# Patient Record
Sex: Male | Born: 1965 | Race: White | Hispanic: No | Marital: Married | Smoking: Never smoker
Health system: Southern US, Community
[De-identification: ages and names within clinical notes are randomized; demographics above are authoritative.]

## PROBLEM LIST (undated history)

## (undated) DIAGNOSIS — A630 Anogenital (venereal) warts: Secondary | ICD-10-CM

## (undated) DIAGNOSIS — E119 Type 2 diabetes mellitus without complications: Secondary | ICD-10-CM

## (undated) DIAGNOSIS — E785 Hyperlipidemia, unspecified: Secondary | ICD-10-CM

## (undated) DIAGNOSIS — M549 Dorsalgia, unspecified: Secondary | ICD-10-CM

## (undated) DIAGNOSIS — M2550 Pain in unspecified joint: Secondary | ICD-10-CM

## (undated) HISTORY — DX: Anogenital (venereal) warts: A63.0

## (undated) HISTORY — DX: Dorsalgia, unspecified: M54.9

## (undated) HISTORY — DX: Type 2 diabetes mellitus without complications: E11.9

## (undated) HISTORY — DX: Pain in unspecified joint: M25.50

## (undated) HISTORY — DX: Hyperlipidemia, unspecified: E78.5

---

## 1998-11-18 ENCOUNTER — Emergency Department (HOSPITAL_COMMUNITY): Admission: EM | Admit: 1998-11-18 | Discharge: 1998-11-18 | Payer: Self-pay | Admitting: Emergency Medicine

## 2001-02-17 HISTORY — PX: OTHER SURGICAL HISTORY: SHX169

## 2014-02-17 HISTORY — PX: HERNIA REPAIR: SHX51

## 2015-02-18 DIAGNOSIS — A630 Anogenital (venereal) warts: Secondary | ICD-10-CM

## 2015-02-18 HISTORY — DX: Anogenital (venereal) warts: A63.0

## 2017-08-03 ENCOUNTER — Ambulatory Visit: Payer: Self-pay | Admitting: Family Medicine

## 2017-08-17 ENCOUNTER — Encounter: Payer: Self-pay | Admitting: Family Medicine

## 2017-08-17 ENCOUNTER — Encounter (INDEPENDENT_AMBULATORY_CARE_PROVIDER_SITE_OTHER): Payer: Self-pay

## 2017-08-17 ENCOUNTER — Encounter

## 2017-08-17 ENCOUNTER — Ambulatory Visit: Payer: Self-pay | Admitting: Family Medicine

## 2017-08-17 VITALS — BP 108/68 | HR 82 | Temp 97.4°F | Ht 72.25 in | Wt 288.0 lb

## 2017-08-17 DIAGNOSIS — Z720 Tobacco use: Secondary | ICD-10-CM

## 2017-08-17 DIAGNOSIS — R945 Abnormal results of liver function studies: Secondary | ICD-10-CM

## 2017-08-17 DIAGNOSIS — E669 Obesity, unspecified: Secondary | ICD-10-CM | POA: Diagnosis not present

## 2017-08-17 DIAGNOSIS — Z1211 Encounter for screening for malignant neoplasm of colon: Secondary | ICD-10-CM

## 2017-08-17 DIAGNOSIS — A630 Anogenital (venereal) warts: Secondary | ICD-10-CM

## 2017-08-17 DIAGNOSIS — Z7689 Persons encountering health services in other specified circumstances: Secondary | ICD-10-CM | POA: Diagnosis not present

## 2017-08-17 DIAGNOSIS — R7989 Other specified abnormal findings of blood chemistry: Secondary | ICD-10-CM

## 2017-08-17 DIAGNOSIS — Z23 Encounter for immunization: Secondary | ICD-10-CM | POA: Diagnosis not present

## 2017-08-17 LAB — CBC WITH DIFFERENTIAL/PLATELET
BASOS ABS: 0 10*3/uL (ref 0.0–0.1)
Basophils Relative: 0.8 % (ref 0.0–3.0)
EOS ABS: 0.1 10*3/uL (ref 0.0–0.7)
Eosinophils Relative: 2.8 % (ref 0.0–5.0)
HEMATOCRIT: 47.2 % (ref 39.0–52.0)
Hemoglobin: 16.4 g/dL (ref 13.0–17.0)
LYMPHS PCT: 29.2 % (ref 12.0–46.0)
Lymphs Abs: 1.4 10*3/uL (ref 0.7–4.0)
MCHC: 34.8 g/dL (ref 30.0–36.0)
MCV: 89.6 fl (ref 78.0–100.0)
MONOS PCT: 8.6 % (ref 3.0–12.0)
Monocytes Absolute: 0.4 10*3/uL (ref 0.1–1.0)
NEUTROS ABS: 2.9 10*3/uL (ref 1.4–7.7)
Neutrophils Relative %: 58.6 % (ref 43.0–77.0)
PLATELETS: 218 10*3/uL (ref 150.0–400.0)
RBC: 5.27 Mil/uL (ref 4.22–5.81)
RDW: 12.6 % (ref 11.5–15.5)
WBC: 4.9 10*3/uL (ref 4.0–10.5)

## 2017-08-17 LAB — LIPID PANEL
Cholesterol: 157 mg/dL (ref 0–200)
HDL: 35.2 mg/dL — ABNORMAL LOW (ref >39.00)
LDL CALC: 90 mg/dL (ref 0–99)
NonHDL: 122.22
Total CHOL/HDL Ratio: 4
Triglycerides: 163 mg/dL — ABNORMAL HIGH (ref 0.0–149.0)
VLDL: 32.6 mg/dL (ref 0.0–40.0)

## 2017-08-17 LAB — COMPREHENSIVE METABOLIC PANEL
ALK PHOS: 59 U/L (ref 39–117)
ALT: 67 U/L — AB (ref 0–53)
AST: 44 U/L — AB (ref 0–37)
Albumin: 4.3 g/dL (ref 3.5–5.2)
BILIRUBIN TOTAL: 0.6 mg/dL (ref 0.2–1.2)
BUN: 14 mg/dL (ref 6–23)
CALCIUM: 9.2 mg/dL (ref 8.4–10.5)
CO2: 30 meq/L (ref 19–32)
CREATININE: 1.1 mg/dL (ref 0.40–1.50)
Chloride: 103 mEq/L (ref 96–112)
GFR: 74.5 mL/min (ref >60.00)
Glucose, Bld: 99 mg/dL (ref 70–99)
Potassium: 4.5 mEq/L (ref 3.5–5.1)
Sodium: 137 mEq/L (ref 135–145)
TOTAL PROTEIN: 6.7 g/dL (ref 6.0–8.3)

## 2017-08-17 LAB — HEMOGLOBIN A1C: HEMOGLOBIN A1C: 5.4 % (ref 4.6–6.5)

## 2017-08-17 LAB — TSH: TSH: 1.85 u[IU]/mL (ref 0.35–4.50)

## 2017-08-17 MED ORDER — IMIQUIMOD 5 % EX CREA: TOPICAL_CREAM | CUTANEOUS | 3 refills | Status: DC

## 2017-08-17 NOTE — Progress Notes (Signed)
Subjective:    Patient ID: Robert Holland, male    DOB: [birth year], 52 y.o.   MRN: [id number]  HPI This is a 52 yo male who presents today to establish care. Accompanied by his wife. 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 Enjoys fishing, golf.    Last CPE- 6-8 years Colonoscopy-  never Tdap-today Flu- some years Dental- not regularly Eye- not regular Exercise- active with his work, yard work Diet- has recently lost 8 pounds, has been watching portions, eating more vegetables Stress- denies excessive   No chest pain, no SOB, no headaches/dizzines, no abdominal pain, n/v/diarrhea/constipation. No dysuria/hematuria. Occasional knee pain, occasional back pain. Has had genital warts for several years.    Past Medical History:  Diagnosis Date  . Genital warts 2017   Past Surgical History:  Procedure Laterality Date  . HERNIA REPAIR  2016  . herniated disk fusion   2003   Family History  Problem Relation Age of Onset  . Cancer Father    Social History   Tobacco Use  . Smoking status: Never Smoker  . Smokeless tobacco: Current User  Substance Use Topics  . Alcohol use: Yes    Comment: social   . Drug use: Never      Review of Systems Per HPI    Objective:   Physical Exam Physical Exam  Constitutional: Oriented to person, place, and time. He appears well-developed and well-nourished.  HENT:  Head: Normocephalic and atraumatic.  Eyes: Conjunctivae are normal.  Neck: Normal range of motion. Neck supple.  Cardiovascular: Normal rate, regular rhythm and normal heart sounds.   Pulmonary/Chest: Effort normal and breath sounds normal.  Genital- proximal shaft and mons with scattered condyloma. No hernia.  Musculoskeletal: No edema.  Neurological: Alert and oriented to person, place, and time.  Skin: Skin is warm and dry.  Psychiatric: Normal mood and affect. Behavior is normal. Judgment and thought content normal.    Vitals reviewed.     BP 108/68 (BP Location: Left Arm, Patient Position: Sitting, Cuff Size: Large)   Pulse 82   Temp (!) 97.4 F (36.3 C) (Oral)   Ht 6' 0.25" (1.835 m)   Wt 288 lb (130.6 kg)   SpO2 97%   BMI 38.79 kg/m  Depression screen [facility] 2/9 08/17/2017  Decreased Interest 0  Down, Depressed, Hopeless 0  PHQ - 2 Score 0       Assessment & Plan:  1. Encounter to establish care - no recent care or outside records to obtain - encouraged regular dental and eye care - return in  1 year for annual visit  2. Obesity (BMI 35.0-39.9 without comorbidity) - encouraged his weight loss efforts - CBC with Differential - Comprehensive metabolic panel - TSH - Lipid Panel - Hemoglobin A1c  3. Genital warts - Provided written and verbal information regarding diagnosis and treatment. - if no improvement with 16 weeks topical therapy, he will let me know and I will refer to derm - imiquimod (ALDARA) 5 % cream; Apply topically 3 (three) times a week. At bedtime for maximum of 16 weeks.  Dispense: 12 each; Refill: 3  4. Screening for colon cancer - Ambulatory referral to Gastroenterology  5. Need for Tdap vaccination - Tdap vaccine greater than or equal to 7yo IM  6. Smokeless tobacco use - encouraged cessation and regular dental care  Clarene Reamer, FNP-BC  [facility], [place]  08/17/2017  10:04 AM

## 2017-08-17 NOTE — Patient Instructions (Addendum)
It was a pleasure to meet you today! I look forward to partnering with you for your health care needs  You will receive a call from gastroenterology to schedule your screening colonoscopy  I will notify you of lab results  I have sent a medication for warts to your pharmacy. If not better with treatment (total of 16 weeks) let me know and I will refer you to a dermatologist.  Follow up in 1 year for your annual exam  Imiquimod skin cream What is this medicine? IMIQUIMOD (i mi KWI mod) cream is used to treat external genital or anal warts. It is also used to treat other skin conditions such as actinic keratosis and certain types of skin cancer. This medicine may be used for other purposes; ask your health care provider or pharmacist if you have questions. COMMON BRAND NAME(S): Tawni Levy What should I tell my health care provider before I take this medicine? They need to know if you have any of these conditions: -decreased immune function -an unusual or allergic reaction to imiquimod, other medicines, foods, dyes, or preservatives -pregnant or trying to get pregnant -breast-feeding How should I use this medicine? This medicine is for external use only. Do not take by mouth. Follow the directions on the prescription label. Apply just before bedtime. Wash your hands before and after use. Apply a thin layer of cream and massage gently into the affected areas until no longer visible. Do not use in the mouth, eyes or the vagina. Use this medicine only on the affected area as directed by your health care provider. Do not use for longer than prescribed. It is important not to use more medicine than prescribed. To do so may increase the chance of side effects. Talk to your pediatrician regarding the use of this medicine in children. While this drug may be prescribed for children as young as 41 years of age for selected conditions, precautions do apply. Overdosage: If you think you have taken too  much of this medicine contact a poison control center or emergency room at once. NOTE: This medicine is only for you. Do not share this medicine with others. What if I miss a dose? If you miss a dose, use it as soon as you can. If it is almost time for your next dose, use only that dose. Do not use double or extra doses. What may interact with this medicine? Interactions are not expected. Do not use any other medicines on the treated area without asking your doctor or health care professional. This list may not describe all possible interactions. Give your health care provider a list of all the medicines, herbs, non-prescription drugs, or dietary supplements you use. Also tell them if you smoke, drink alcohol, or use illegal drugs. Some items may interact with your medicine. What should I watch for while using this medicine? Visit your health care professional for regular checks on your progress. Do not use this medicine until the skin has healed from any other drug (example: podofilox or podophyllin resin) or surgical skin treatment. Females should receive regular pelvic exams while being treated for genital warts. Most patients see improvement within 4 weeks. It may take up to 16 weeks to see a full clearing of the warts. This medicine is not a cure. New warts may develop during or after treatment. Avoid sexual (genital, anal, oral) contact while the cream is on the skin. If warts are visible in the genital area, sexual contact should be avoided until  the warts are treated. The use of latex condoms during sexual contact may reduce, but not entirely prevent, infecting others. This medicine may weaken condoms, diaphragms, cervical caps or other barrier devices and make them less effective as birth control. Do not cover the treated area with an airtight bandage. Cotton gauze dressings can be used. Cotton underwear can be worn after using this medicine on the genital or anal area. Actinic keratoses that  were not seen before may appear during treatment and may later go away. The treatment area and surrounding area may lighten or darken after treatment with this medicine. These skin color changes may be permanent in some patients. If you experience a skin reaction at the treatment site that interferes or prevents you from doing any daily activity, contact your health care provider. You may need a rest period from treatment. Treatment may be restarted once the reaction has gotten better as recommended by your doctor or health care professional. This medicine can make you more sensitive to the sun. Keep out of the sun. If you cannot avoid being in the sun, wear protective clothing and use sunscreen. Do not use sun lamps or tanning beds/booths. What side effects may I notice from receiving this medicine? Side effects that you should report to your doctor or health care professional as soon as possible: -open sores with or without drainage -skin infection -skin rash -unusual or severe skin reaction Side effects that usually do not require medical attention (report to your doctor or health care professional if they continue or are bothersome): -burning or itching -redness of the skin (very common but is usually not painful or harmful) -scabbing, crusting, or peeling skin -skin that becomes hard or thickened -swelling of the skin This list may not describe all possible side effects. Call your doctor for medical advice about side effects. You may report side effects to FDA at 1-800-FDA-1088. Where should I keep my medicine? Keep out of the reach of children. Store between 4 and 25 degrees C (39 and 77 degrees F). Do not freeze. Throw away any unused medicine after the expiration date. Discard packet after applying to affected area. Partial packets should not be saved or reused. NOTE: This sheet is a summary. It may not cover all possible information. If you have questions about this medicine, talk to your  doctor, pharmacist, or health care provider.  2018 Elsevier/Gold Standard (2008-01-18 10:33:25)  Genital Warts Genital warts are a common STD (sexually transmitted disease). They may appear as small bumps on the tissues of the genital area or anal area. Sometimes, they can become irritated and cause pain. Genital warts are easily passed to other people through sexual contact. Getting treatment is important because genital warts can lead to other problems. In females, the virus that causes genital warts may increase the risk of cervical cancer. What are the causes? Genital warts are caused by a virus that is called human papillomavirus (HPV). HPV is spread by having unprotected sex with an infected person. It can be spread through vaginal, anal, and oral sex. Many people do not know that they are infected. They may be infected for years without problems. However, even if they do not have problems, they can pass the infection to their sexual partners. What increases the risk? Genital warts are more likely to develop in:  People who have unprotected sex.  People who have multiple sexual partners.  People who become sexually active before they are 53 years of age.  Men who  are not circumcised.  Women who have a male sexual partner who is not circumcised.  People who have a weakened body defense system (immune system) due to disease or medicine.  People who smoke.  What are the signs or symptoms? Symptoms of genital warts include:  Small growths in the genital area or anal area. These warts often grow in clusters.  Itching and irritation in the genital area or anal area.  Bleeding from the warts.  Painful sexual intercourse.  How is this diagnosed? Genital warts can usually be diagnosed from their appearance on the vagina, vulva, penis, perineum, anus, or rectum. Tests may also be done, such as:  Biopsy. A tissue sample is removed so it can be looked at under a  microscope.  Colposcopy. In females, a magnifying tool is used to examine the vagina and cervix. Certain solutions may be used to make the HPV cells change color so they can be seen more easily.  A Pap test in females.  Tests for other STDs.  How is this treated? Treatment for genital warts may include:  Applying prescription medicines to the warts. These may be solutions or creams.  Freezing the warts with liquid nitrogen (cryotherapy).  Burning the warts with: ? Laser treatment. ? An electrified probe (electrocautery).  Injecting a substance (Candida antigen or Trichophyton antigen) into the warts to help the body's immune system to fight off the warts.  Interferon injections.  Surgery to remove the warts.  Follow these instructions at home: Medicines  Apply over-the-counter and prescription medicines only as told by your health care provider.  Do not treat genital warts with medicines that are used for treating hand warts.  Talk with your health care provider about using over-the-counter anti-itch creams. General instructions  Do not touch or scratch the warts.  Do not have sex until your treatment has been completed.  Tell your current and past sexual partners about your condition because they may also need treatment.  Keep all follow-up visits as told by your health care provider. This is important.  After treatment, use condoms during sex to prevent future infections. Other Instructions for Women  Women who have genital warts might need increased screening for cervical cancer. This type of cancer is slow growing and can be cured if it is found early. Chances of developing cervical cancer are increased with HPV.  If you become pregnant, tell your health care provider that you have had HPV. Your health care provider will monitor you closely during pregnancy to be sure that your baby is safe. How is this prevented? Talk with your health care provider about getting  the HPV vaccines. These vaccines prevent some HPV infections and cancers. It is recommended that the vaccine be given to males and females who are 59-10 years of age. It will not work if you already have HPV, and it is not recommended for pregnant women. Contact a health care provider if:  You have redness, swelling, or pain in the area of the treated skin.  You have a fever.  You feel generally ill.  You feel lumps in and around your genital area or anal area.  You have bleeding in your genital area or anal area.  You have pain during sexual intercourse. This information is not intended to replace advice given to you by your health care provider. Make sure you discuss any questions you have with your health care provider. Document Released: 02/01/2000 Document Revised: 07/12/2015 Document Reviewed: 05/01/2014 Elsevier Interactive Patient  Education  2018 Elsevier Inc.  

## 2017-08-17 NOTE — Addendum Note (Signed)
Addended by: Clarene Reamer B on: 08/17/2017 12:51 PM   Modules accepted: Orders

## 2017-08-18 ENCOUNTER — Telehealth: Payer: Self-pay | Admitting: Gastroenterology

## 2017-08-18 NOTE — Telephone Encounter (Signed)
Patient called back and is ready to schedule screening colonoscopy.

## 2017-08-18 NOTE — Telephone Encounter (Signed)
LVM returning patients call.  Thanks Robert Holland 

## 2017-08-25 ENCOUNTER — Other Ambulatory Visit: Payer: Self-pay

## 2017-08-25 DIAGNOSIS — Z1211 Encounter for screening for malignant neoplasm of colon: Secondary | ICD-10-CM

## 2017-08-28 ENCOUNTER — Telehealth: Payer: Self-pay

## 2017-08-28 ENCOUNTER — Other Ambulatory Visit: Payer: Self-pay

## 2017-08-28 MED ORDER — NA SULFATE-K SULFATE-MG SULF 17.5-3.13-1.6 GM/177ML PO SOLN: 1.0000 | Freq: Once | ORAL | 0 refills | Status: AC

## 2017-08-28 NOTE — Telephone Encounter (Signed)
Faxed prescription and instructions to pharmacy in patients chart just in case he did not get my message.  Thanks [employer]

## 2017-08-31 ENCOUNTER — Encounter: Payer: Self-pay | Admitting: *Deleted

## 2017-09-01 ENCOUNTER — Ambulatory Visit
Admission: RE | Admit: 2017-09-01 | Discharge: 2017-09-01 | Disposition: A | Discharge status: Home / Self Care | Payer: BC Managed Care – PPO | Source: Ambulatory Visit | Attending: Gastroenterology | Admitting: Gastroenterology

## 2017-09-01 ENCOUNTER — Encounter
Admission: RE | Disposition: A | Discharge status: Home / Self Care | Payer: Self-pay | Source: Ambulatory Visit | Attending: Gastroenterology

## 2017-09-01 ENCOUNTER — Ambulatory Visit: Payer: BC Managed Care – PPO | Admitting: Certified Registered Nurse Anesthetist

## 2017-09-01 ENCOUNTER — Encounter: Payer: Self-pay | Admitting: *Deleted

## 2017-09-01 DIAGNOSIS — K5730 Diverticulosis of large intestine without perforation or abscess without bleeding: Secondary | ICD-10-CM | POA: Diagnosis not present

## 2017-09-01 DIAGNOSIS — D1779 Benign lipomatous neoplasm of other sites: Secondary | ICD-10-CM | POA: Insufficient documentation

## 2017-09-01 DIAGNOSIS — D125 Benign neoplasm of sigmoid colon: Secondary | ICD-10-CM | POA: Diagnosis not present

## 2017-09-01 DIAGNOSIS — D120 Benign neoplasm of cecum: Secondary | ICD-10-CM | POA: Diagnosis not present

## 2017-09-01 DIAGNOSIS — D122 Benign neoplasm of ascending colon: Secondary | ICD-10-CM

## 2017-09-01 DIAGNOSIS — K621 Rectal polyp: Secondary | ICD-10-CM

## 2017-09-01 DIAGNOSIS — K648 Other hemorrhoids: Secondary | ICD-10-CM

## 2017-09-01 DIAGNOSIS — D128 Benign neoplasm of rectum: Secondary | ICD-10-CM | POA: Diagnosis not present

## 2017-09-01 DIAGNOSIS — D124 Benign neoplasm of descending colon: Secondary | ICD-10-CM | POA: Diagnosis not present

## 2017-09-01 DIAGNOSIS — Z1211 Encounter for screening for malignant neoplasm of colon: Secondary | ICD-10-CM

## 2017-09-01 HISTORY — PX: COLONOSCOPY WITH PROPOFOL: SHX5780

## 2017-09-01 SURGERY — COLONOSCOPY WITH PROPOFOL
Anesthesia: General

## 2017-09-01 MED ORDER — LIDOCAINE HCL (CARDIAC) PF 100 MG/5ML IV SOSY
PREFILLED_SYRINGE | INTRAVENOUS | Status: DC | PRN
  Administered 2017-09-01: 50 mg via INTRAVENOUS

## 2017-09-01 MED ORDER — PROPOFOL 10 MG/ML IV BOLUS
INTRAVENOUS | Status: DC | PRN
  Administered 2017-09-01: 70 mg via INTRAVENOUS

## 2017-09-01 MED ORDER — PROPOFOL 500 MG/50ML IV EMUL
INTRAVENOUS | Status: AC
  Filled 2017-09-01: qty 50

## 2017-09-01 MED ORDER — LIDOCAINE HCL (PF) 2 % IJ SOLN
INTRAMUSCULAR | Status: AC
  Filled 2017-09-01: qty 10

## 2017-09-01 MED ORDER — SODIUM CHLORIDE 0.9 % IV SOLN
INTRAVENOUS | Status: DC
  Administered 2017-09-01: 1000 mL via INTRAVENOUS

## 2017-09-01 MED ORDER — PROPOFOL 500 MG/50ML IV EMUL
INTRAVENOUS | Status: DC | PRN
  Administered 2017-09-01: 175 ug/kg/min via INTRAVENOUS

## 2017-09-01 NOTE — H&P (Signed)
Cephas Darby, MD [address]  [place], [place] [address]  Main: [phone]  Fax: [phone] Pager: [phone]  Primary Care Physician:  Elby Beck, FNP Primary Gastroenterologist:  Dr. Cephas Darby  Pre-Procedure History & Physical: HPI:  Robert Holland is a 52 y.o. male is here for an colonoscopy.   Past Medical History:  Diagnosis Date  . Genital warts 2017    Past Surgical History:  Procedure Laterality Date  . HERNIA REPAIR  2016  . herniated disk fusion   2003    Prior to Admission medications   Medication Sig Start Date End Date Taking? Authorizing Provider  imiquimod (ALDARA) 5 % cream Apply topically 3 (three) times a week. At bedtime for maximum of 16 weeks. 08/17/17   Elby Beck, FNP    Allergies as of 08/25/2017  . (No Known Allergies)    Family History  Problem Relation Age of Onset  . Cancer Father     Social History   Socioeconomic History  . Marital status: Divorced    Spouse name: Not on file  . Number of children: Not on file  . Years of education: Not on file  . Highest education level: Not on file  Occupational History  . Not on file  Social Needs  . Financial resource strain: Not on file  . Food insecurity:    Worry: Not on file    Inability: Not on file  . Transportation needs:    Medical: Not on file    Non-medical: Not on file  Tobacco Use  . Smoking status: Never Smoker  . Smokeless tobacco: Current User    Types: Snuff  Substance and Sexual Activity  . Alcohol use: Yes    Comment: social   . Drug use: Never  . Sexual activity: Yes    Partners: Female  Lifestyle  . Physical activity:    Days per week: Not on file    Minutes per session: Not on file  . Stress: Not on file  Relationships  . Social connections:    Talks on phone: Not on file    Gets together: Not on file    Attends religious service: Not on file    Active member of club or organization: Not on file    Attends  meetings of clubs or organizations: Not on file    Relationship status: Not on file  . Intimate partner violence:    Fear of current or ex partner: Not on file    Emotionally abused: Not on file    Physically abused: Not on file    Forced sexual activity: Not on file  Other Topics Concern  . Not on file  Social History Narrative  . Not on file    Review of Systems: See HPI, otherwise negative ROS  Physical Exam: BP 127/90   Pulse 80   Temp (!) 96.9 F (36.1 C) (Tympanic)   Resp 16   Ht 6' (1.829 m)   Wt 275 lb (124.7 kg)   SpO2 100%   BMI 37.30 kg/m  General:   Alert,  pleasant and cooperative in NAD Head:  Normocephalic and atraumatic. Neck:  Supple; no masses or thyromegaly. Lungs:  Clear throughout to auscultation.    Heart:  Regular rate and rhythm. Abdomen:  Soft, nontender and nondistended. Normal bowel sounds, without guarding, and without rebound.   Neurologic:  Alert and  oriented x4;  grossly normal neurologically.  Impression/Plan: Robert Holland is  here for an colonoscopy to be performed for colon cancer screening  Risks, benefits, limitations, and alternatives regarding  colonoscopy have been reviewed with the patient.  Questions have been answered.  All parties agreeable.   Sherri Sear, MD  09/01/2017, 2:52 PM

## 2017-09-01 NOTE — Transfer of Care (Signed)
Immediate Anesthesia Transfer of Care Note  Patient: Robert Holland  Procedure(s) Performed: COLONOSCOPY WITH PROPOFOL (N/A )  Patient Location: PACU  Anesthesia Type:General  Level of Consciousness: awake, alert  and oriented  Airway & Oxygen Therapy: Patient Spontanous Breathing  Post-op Assessment: Report given to RN and Post -op Vital signs reviewed and stable  Post vital signs: Reviewed and stable  Last Vitals:  Vitals Value Taken Time  BP 114/67 09/01/2017  3:47 PM  Temp 35.9 C 09/01/2017  3:48 PM  Pulse 78 09/01/2017  3:47 PM  Resp 16 09/01/2017  3:47 PM  SpO2 97 % 09/01/2017  3:47 PM  Vitals shown include unvalidated device data.  Last Pain:  Vitals:   09/01/17 1548  TempSrc: Tympanic         Complications: No apparent anesthesia complications

## 2017-09-01 NOTE — Anesthesia Post-op Follow-up Note (Signed)
Anesthesia QCDR form completed.        

## 2017-09-01 NOTE — Op Note (Signed)
[facility] Gastroenterology Patient Name: Robert Holland Procedure Date: 09/01/2017 2:56 PM MRN: [id number] Account #: [id number] Date of Birth: [birth year] Admit Type: Outpatient Age: 52 Room: [facility] ENDO ROOM 4 Gender: Male Note Status: Finalized Procedure:            Colonoscopy Indications:          Screening for colorectal malignant neoplasm, This is                        the patient's first colonoscopy Providers:            Lin Landsman MD, MD Referring MD:         Elby Beck (Referring MD) Medicines:            Monitored Anesthesia Care Complications:        No immediate complications. Estimated blood loss: None. Procedure:            Pre-Anesthesia Assessment:                       - Prior to the procedure, a History and Physical was                        performed, and patient medications and allergies were                        reviewed. The patient is competent. The risks and                        benefits of the procedure and the sedation options and                        risks were discussed with the patient. All questions                        were answered and informed consent was obtained.                        Patient identification and proposed procedure were                        verified by the physician, the nurse, the                        anesthesiologist, the anesthetist and the technician in                        the pre-procedure area in the procedure room in the                        endoscopy suite. Mental Status Examination: alert and                        oriented. Airway Examination: normal oropharyngeal                        airway and neck mobility. Respiratory Examination:                        clear to auscultation. CV Examination: normal.  Prophylactic Antibiotics: The patient does not require                        prophylactic antibiotics. Prior Anticoagulants: The         patient has taken no previous anticoagulant or                        antiplatelet agents. ASA Grade Assessment: II - A                        patient with mild systemic disease. After reviewing the                        risks and benefits, the patient was deemed in                        satisfactory condition to undergo the procedure. The                        anesthesia plan was to use monitored anesthesia care                        (MAC). Immediately prior to administration of                        medications, the patient was re-assessed for adequacy                        to receive sedatives. The heart rate, respiratory rate,                        oxygen saturations, blood pressure, adequacy of                        pulmonary ventilation, and response to care were                        monitored throughout the procedure. The physical status                        of the patient was re-assessed after the procedure.                       After obtaining informed consent, the colonoscope was                        passed under direct vision. Throughout the procedure,                        the patient's blood pressure, pulse, and oxygen                        saturations were monitored continuously. The                        Colonoscope was introduced through the anus and                        advanced to the the terminal ileum. The colonoscopy was  performed with moderate difficulty due to poor bowel                        prep, significant looping and the patient's body                        habitus. Successful completion of the procedure was                        aided by applying abdominal pressure and lavage. The                        patient tolerated the procedure well. The quality of                        the bowel preparation was evaluated using the BBPS                        [facility] Bowel Preparation Scale) with scores of: Right                         Colon = 3, Transverse Colon = 3 and Left Colon = 3                        (entire mucosa seen well with no residual staining,                        small fragments of stool or opaque liquid). The total                        BBPS score equals 9. Findings:      The perianal and digital rectal examinations were normal. Pertinent       negatives include normal sphincter tone and no palpable rectal lesions.      A 6 mm polyp was found in the cecum. The polyp was sessile. The polyp       was removed with a hot snare. Resection and retrieval were complete.      A 4 mm polyp was found in the proximal ascending colon. The polyp was       sessile. The polyp was removed with a cold biopsy forceps. Resection and       retrieval were complete.      Three sessile polyps were found in the rectum, sigmoid colon and       descending colon. The polyps were 6 to 7 mm in size. These polyps were       removed with a hot snare. Resection and retrieval were complete.      A few small-mouthed diverticula were found in the sigmoid colon.      Non-bleeding internal hemorrhoids were found during retroflexion. The       hemorrhoids were medium-sized.      There was a small lipoma, in the proximal ascending colon.      The terminal ileum appeared normal. Impression:           - One 6 mm polyp in the cecum, removed with a hot                        snare. Resected and retrieved.                       -  One 4 mm polyp in the proximal ascending colon,                        removed with a cold biopsy forceps. Resected and                        retrieved.                       - Three 6 to 7 mm polyps in the rectum, in the sigmoid                        colon and in the descending colon, removed with a hot                        snare. Resected and retrieved.                       - Diverticulosis in the sigmoid colon.                       - Non-bleeding internal hemorrhoids. Recommendation:       -  Discharge patient to home (with escort).                       - Low fat diet and low sodium diet today.                       - Continue present medications.                       - Await pathology results.                       - Repeat colonoscopy in 3 years for surveillance of                        multiple polyps. Procedure Code(s):    --- Professional ---                       [id number], Colonoscopy, flexible; with removal of tumor(s),                        polyp(s), or other lesion(s) by snare technique                       45380, 40, Colonoscopy, flexible; with biopsy, single                        or multiple Diagnosis Code(s):    --- Professional ---                       Z12.11, Encounter for screening for malignant neoplasm                        of colon                       D12.0, Benign neoplasm of cecum                       D12.2,  Benign neoplasm of ascending colon                       K62.1, Rectal polyp                       D12.5, Benign neoplasm of sigmoid colon                       D12.4, Benign neoplasm of descending colon                       K64.8, Other hemorrhoids                       K57.30, Diverticulosis of large intestine without                        perforation or abscess without bleeding CPT copyright 2017 American Medical Association. All rights reserved. The codes documented in this report are preliminary and upon coder review may  be revised to meet current compliance requirements. Dr. Ulyess Mort Lin Landsman MD, MD 09/01/2017 3:47:47 PM This report has been signed electronically. Number of Addenda: 0 Note Initiated On: 09/01/2017 2:56 PM Scope Withdrawal Time: 0 hours 28 minutes 56 seconds  Total Procedure Duration: 0 hours 32 minutes 17 seconds       [facility]

## 2017-09-01 NOTE — Anesthesia Postprocedure Evaluation (Signed)
Anesthesia Post Note  Patient: Miquan Tandon  Procedure(s) Performed: COLONOSCOPY WITH PROPOFOL (N/A )  Patient location during evaluation: Endoscopy Anesthesia Type: General Level of consciousness: awake and alert Pain management: pain level controlled Vital Signs Assessment: post-procedure vital signs reviewed and stable Respiratory status: spontaneous breathing, nonlabored ventilation, respiratory function stable and patient connected to nasal cannula oxygen Cardiovascular status: blood pressure returned to baseline and stable Postop Assessment: no apparent nausea or vomiting Anesthetic complications: no     Last Vitals:  Vitals:   09/01/17 1547 09/01/17 1548  BP: 114/67   Pulse: 74   Resp: (!) 146   Temp: 36.4 C (!) 35.9 C  SpO2: 98%     Last Pain:  Vitals:   09/01/17 1548  TempSrc: Tympanic                 Martha Clan

## 2017-09-01 NOTE — Anesthesia Procedure Notes (Signed)
Date/Time: 09/01/2017 3:04 PM Performed by: Johnna Acosta, CRNA Pre-anesthesia Checklist: Patient identified, Emergency Drugs available, Suction available, Patient being monitored and Timeout performed Patient Re-evaluated:Patient Re-evaluated prior to induction Oxygen Delivery Method: Nasal cannula Preoxygenation: Pre-oxygenation with 100% oxygen

## 2017-09-01 NOTE — Anesthesia Preprocedure Evaluation (Signed)
Anesthesia Evaluation  Patient identified by MRN, date of birth, ID band Patient awake    Reviewed: Allergy & Precautions, H&P , NPO status , Patient's Chart, lab work & pertinent test results, reviewed documented beta blocker date and time   History of Anesthesia Complications Negative for: history of anesthetic complications  Airway Mallampati: I  TM Distance: >3 FB Neck ROM: full    Dental  (+) Dental Advidsory Given, Caps, Teeth Intact   Pulmonary neg pulmonary ROS,           Cardiovascular Exercise Tolerance: Good negative cardio ROS       Neuro/Psych negative neurological ROS  negative psych ROS   GI/Hepatic negative GI ROS, Neg liver ROS,   Endo/Other  negative endocrine ROS  Renal/GU negative Renal ROS  negative genitourinary   Musculoskeletal   Abdominal   Peds  Hematology negative hematology ROS (+)   Anesthesia Other Findings Past Medical History: 2017: Genital warts   Reproductive/Obstetrics negative OB ROS                             Anesthesia Physical Anesthesia Plan  ASA: II  Anesthesia Plan: General   Post-op Pain Management:    Induction: Intravenous  PONV Risk Score and Plan: 2 and Propofol infusion and TIVA  Airway Management Planned: Nasal Cannula  Additional Equipment:   Intra-op Plan:   Post-operative Plan:   Informed Consent: I have reviewed the patients History and Physical, chart, labs and discussed the procedure including the risks, benefits and alternatives for the proposed anesthesia with the patient or authorized representative who has indicated his/her understanding and acceptance.   Dental Advisory Given  Plan Discussed with: Anesthesiologist, CRNA and Surgeon  Anesthesia Plan Comments:         Anesthesia Quick Evaluation

## 2017-09-02 ENCOUNTER — Encounter: Payer: Self-pay | Admitting: Gastroenterology

## 2017-09-04 LAB — SURGICAL PATHOLOGY

## 2017-09-07 ENCOUNTER — Encounter: Payer: Self-pay | Admitting: Gastroenterology

## 2017-09-21 ENCOUNTER — Encounter: Payer: Self-pay | Admitting: Family Medicine

## 2017-09-21 ENCOUNTER — Ambulatory Visit (INDEPENDENT_AMBULATORY_CARE_PROVIDER_SITE_OTHER): Payer: BC Managed Care – PPO | Admitting: Family Medicine

## 2017-09-21 DIAGNOSIS — R7989 Other specified abnormal findings of blood chemistry: Secondary | ICD-10-CM

## 2017-09-21 DIAGNOSIS — R945 Abnormal results of liver function studies: Secondary | ICD-10-CM

## 2017-09-21 LAB — HEPATIC FUNCTION PANEL
ALT: 53 U/L (ref 0–53)
AST: 30 U/L (ref 0–37)
Albumin: 4.3 g/dL (ref 3.5–5.2)
Alkaline Phosphatase: 62 U/L (ref 39–117)
BILIRUBIN DIRECT: 0.1 mg/dL (ref 0.0–0.3)
BILIRUBIN TOTAL: 0.6 mg/dL (ref 0.2–1.2)
Total Protein: 6.9 g/dL (ref 6.0–8.3)

## 2017-09-21 NOTE — Progress Notes (Signed)
   Subjective:    Patient ID: Stanely Sexson, male    DOB: June 01, 1965, 52 y.o.   MRN: [id number]  HPI Patient presented today for lab only visit, was scheduled for OV. Sent to lab.    Review of Systems     Objective:   Physical Exam    BP 114/78 (BP Location: Right Arm, Patient Position: Sitting, Cuff Size: Large)   Pulse 85   Temp 97.9 F (36.6 C) (Oral)   Ht 6' (1.829 m)   Wt 284 lb (128.8 kg)   SpO2 97%   BMI 38.52 kg/m  Wt Readings from Last 3 Encounters:  09/21/17 284 lb (128.8 kg)  09/01/17 275 lb (124.7 kg)  08/17/17 288 lb (130.6 kg)       Assessment & Plan:  Labs

## 2018-09-27 ENCOUNTER — Other Ambulatory Visit: Payer: Self-pay

## 2018-09-27 DIAGNOSIS — Z20822 Contact with and (suspected) exposure to covid-19: Secondary | ICD-10-CM

## 2018-09-28 LAB — NOVEL CORONAVIRUS, NAA: SARS-CoV-2, NAA: NOT DETECTED

## 2018-11-29 ENCOUNTER — Other Ambulatory Visit: Payer: Self-pay

## 2018-11-29 DIAGNOSIS — Z20822 Contact with and (suspected) exposure to covid-19: Secondary | ICD-10-CM

## 2018-11-30 LAB — NOVEL CORONAVIRUS, NAA: SARS-CoV-2, NAA: NOT DETECTED

## 2019-03-31 ENCOUNTER — Ambulatory Visit (INDEPENDENT_AMBULATORY_CARE_PROVIDER_SITE_OTHER): Payer: BC Managed Care – PPO | Admitting: Family Medicine

## 2019-03-31 ENCOUNTER — Other Ambulatory Visit: Payer: Self-pay

## 2019-03-31 ENCOUNTER — Encounter (INDEPENDENT_AMBULATORY_CARE_PROVIDER_SITE_OTHER): Payer: Self-pay | Admitting: Family Medicine

## 2019-03-31 VITALS — BP 129/79 | HR 77 | Temp 98.0°F | Ht 72.0 in | Wt 295.0 lb

## 2019-03-31 DIAGNOSIS — E782 Mixed hyperlipidemia: Secondary | ICD-10-CM

## 2019-03-31 DIAGNOSIS — R0602 Shortness of breath: Secondary | ICD-10-CM | POA: Diagnosis not present

## 2019-03-31 DIAGNOSIS — R5383 Other fatigue: Secondary | ICD-10-CM | POA: Diagnosis not present

## 2019-03-31 DIAGNOSIS — Z1331 Encounter for screening for depression: Secondary | ICD-10-CM

## 2019-03-31 DIAGNOSIS — R7989 Other specified abnormal findings of blood chemistry: Secondary | ICD-10-CM

## 2019-03-31 DIAGNOSIS — Z9189 Other specified personal risk factors, not elsewhere classified: Secondary | ICD-10-CM

## 2019-03-31 DIAGNOSIS — Z0289 Encounter for other administrative examinations: Secondary | ICD-10-CM

## 2019-03-31 DIAGNOSIS — Z6841 Body Mass Index (BMI) 40.0 and over, adult: Secondary | ICD-10-CM

## 2019-03-31 NOTE — Progress Notes (Signed)
Chief Complaint:   OBESITY Robert Holland (MR# [id number]) is a 54 y.o. male who presents for evaluation and treatment of obesity and related comorbidities. Current BMI is Body mass index is 40.01 kg/m. Robert Holland has been struggling with his weight for many years and has been unsuccessful in either losing weight, maintaining weight loss, or reaching his healthy weight goal.  Robert Holland is currently in the action stage of change and ready to dedicate time achieving and maintaining a healthier weight. Robert Holland is interested in becoming our patient and working on intensive lifestyle modifications including (but not limited to) diet and exercise for weight loss.  Robert Holland's habits were reviewed today and are as follows: His family eats meals together, he thinks his family will eat healthier with him, his desired weight loss is 60 lbs, he has been heavy most of his life, he started gaining weight 5 years ago, his heaviest weight ever was 340 pounds, he skips meals frequently, he frequently makes poor food choices, he frequently eats larger portions than normal and he struggles with emotional eating.  Depression Screen Robert Holland's Food and Mood (modified PHQ-9) score was 7.  Depression screen PHQ 2/9 03/31/2019  Decreased Interest 1  Down, Depressed, Hopeless 1  PHQ - 2 Score 2  Altered sleeping 1  Tired, decreased energy 2  Change in appetite 1  Feeling bad or failure about yourself  1  Trouble concentrating 0  Moving slowly or fidgety/restless 0  Suicidal thoughts 0  PHQ-9 Score 7  Difficult doing work/chores Somewhat difficult   Subjective:   1. Other fatigue Robert Holland admits to daytime somnolence and admits to waking up still tired. Patent has a history of symptoms of daytime fatigue. Robert Holland generally gets 6 or 7 hours of sleep per night, and states that he has generally restful sleep. Snoring is present. Apneic episodes are not present. Epworth Sleepiness Score is 6.  2. Shortness of breath on exertion Robert Holland  notes increasing shortness of breath with exercising and seems to be worsening over time with weight gain. He notes getting out of breath sooner with activity than he used to. This has not gotten worse recently. Robert Holland denies shortness of breath at rest or orthopnea.  3. Elevated LFTs Robert Holland has a history of mildly elevated LFTs in 2019.  4. Mixed hyperlipidemia Robert Holland has a history of elevated triglycerides and low HDL in 2019.  5. At risk for heart disease Robert Holland is at a higher than average risk for cardiovascular disease due to obesity. Reviewed: no chest pain on exertion, no dyspnea on exertion, and no swelling of ankles.  Assessment/Plan:   1. Other fatigue Robert Holland does feel that his weight is causing his energy to be lower than it should be. Fatigue may be related to obesity, depression or many other causes. Labs will be ordered, and in the meanwhile, Robert Holland will focus on self care including making healthy food choices, increasing physical activity and focusing on stress reduction.  - EKG 12-Lead - CBC with Differential/Platelet - Comprehensive metabolic panel - Hemoglobin A1c - Insulin, random - VITAMIN D 25 Hydroxy (Vit-D Deficiency, Fractures) - Vitamin B12 - Folate - T3 - T4, free - TSH  2. Shortness of breath on exertion Robert Holland does feel that he gets out of breath more easily that he used to when he exercises. Robert Holland shortness of breath appears to be obesity related and exercise induced. He has agreed to work on weight loss and gradually increase exercise to treat his exercise  induced shortness of breath. Will continue to monitor closely.  3. Elevated LFTs We discussed the likely diagnosis of non-alcoholic fatty liver disease today and how this condition is obesity related. Robert Holland was educated the importance of weight loss. Robert Holland agreed to continue with his weight loss efforts with healthier diet and exercise as an essential part of his treatment plan.  - Lipid Panel With LDL/HDL  Ratio  4. Mixed hyperlipidemia Cardiovascular risk and specific lipid/LDL goals reviewed. We discussed several lifestyle modifications today. Robert Holland will start his diet, and will continue to work on exercise and weight loss efforts. Orders and follow up as documented in patient record.   Counseling Intensive lifestyle modifications are the first line treatment for this issue. . Dietary changes: Increase soluble fiber. Decrease simple carbohydrates. . Exercise changes: Moderate to vigorous-intensity aerobic activity 150 minutes per week if tolerated. . Lipid-lowering medications: see documented in medical record.  - Lipid Panel With LDL/HDL Ratio  5. Depression screening Robert Holland had a positive depression screening. Depression is commonly associated with obesity and often results in emotional eating behaviors. We will monitor this closely and work on CBT to help improve the non-hunger eating patterns. Referral to Psychology may be required if no improvement is seen as he continues in our clinic.  6. At risk for heart disease Robert Holland was given approximately 30 minutes of coronary artery disease prevention counseling today. He is 54 y.o. male and has risk factors for heart disease including obesity. We discussed intensive lifestyle modifications today with an emphasis on specific weight loss instructions and strategies.   Repetitive spaced learning was employed today to elicit superior memory formation and behavioral change.  7. Class 3 severe obesity with serious comorbidity and body mass index (BMI) of 40.0 to 44.9 in adult, unspecified obesity type (HCC) Robert Holland is currently in the action stage of change and his goal is to continue with weight loss efforts. I recommend Robert Holland begin the structured treatment plan as follows:  He has agreed to the Category 4 Plan.  Exercise goals: No exercise has been prescribed at this time.   Behavioral modification strategies: increasing lean protein intake and no  skipping meals.  He was informed of the importance of frequent follow-up visits to maximize his success with intensive lifestyle modifications for his multiple health conditions. He was informed we would discuss his lab results at his next visit unless there is a critical issue that needs to be addressed sooner. Robert Holland agreed to keep his next visit at the agreed upon time to discuss these results.  Objective:   Blood pressure 129/79, pulse 77, temperature 98 F (36.7 C), temperature source Oral, height 6' (1.829 m), weight 295 lb (133.8 kg), SpO2 97 %. Body mass index is 40.01 kg/m.  EKG: Normal sinus rhythm, rate 80 BPM.  Indirect Calorimeter completed today shows a VO2 of 371 and a REE of 2584.  His calculated basal metabolic rate is [id number] thus his basal metabolic rate is worse than expected.  General: Cooperative, alert, well developed, in no acute distress. HEENT: Conjunctivae and lids unremarkable. Cardiovascular: Regular rhythm.  Lungs: Normal work of breathing. Neurologic: No focal deficits.   Lab Results  Component Value Date   CREATININE 1.10 08/17/2017   BUN 14 08/17/2017   NA 137 08/17/2017   K 4.5 08/17/2017   CL 103 08/17/2017   CO2 30 08/17/2017   Lab Results  Component Value Date   ALT 53 09/21/2017   AST 30 09/21/2017   ALKPHOS 62  09/21/2017   BILITOT 0.6 09/21/2017   Lab Results  Component Value Date   HGBA1C 5.4 08/17/2017   No results found for: INSULIN Lab Results  Component Value Date   TSH 1.85 08/17/2017   Lab Results  Component Value Date   CHOL 157 08/17/2017   HDL 35.20 (L) 08/17/2017   LDLCALC 90 08/17/2017   TRIG 163.0 (H) 08/17/2017   CHOLHDL 4 08/17/2017   Lab Results  Component Value Date   WBC 4.9 08/17/2017   HGB 16.4 08/17/2017   HCT 47.2 08/17/2017   MCV 89.6 08/17/2017   PLT 218.0 08/17/2017   No results found for: IRON, TIBC, FERRITIN  Attestation Statements:   Reviewed by clinician on day of visit: allergies,  medications, problem list, medical history, surgical history, family history, social history, and previous encounter notes.   I, Trixie Dredge, am acting as transcriptionist for Dennard Nip, MD.  I have reviewed the above documentation for accuracy and completeness, and I agree with the above. - Dennard Nip, MD

## 2019-04-01 LAB — LIPID PANEL WITH LDL/HDL RATIO
Cholesterol, Total: 177 mg/dL (ref 100–199)
HDL: 38 mg/dL — ABNORMAL LOW (ref >39)
LDL Chol Calc (NIH): 99 mg/dL (ref 0–99)
LDL/HDL Ratio: 2.6 ratio (ref 0.0–3.6)
Triglycerides: 235 mg/dL — ABNORMAL HIGH (ref 0–149)
VLDL Cholesterol Cal: 40 mg/dL (ref 5–40)

## 2019-04-01 LAB — COMPREHENSIVE METABOLIC PANEL
ALT: 87 IU/L — ABNORMAL HIGH (ref 0–44)
AST: 50 IU/L — ABNORMAL HIGH (ref 0–40)
Albumin/Globulin Ratio: 1.9 (ref 1.2–2.2)
Albumin: 4.6 g/dL (ref 3.8–4.9)
Alkaline Phosphatase: 84 IU/L (ref 39–117)
BUN/Creatinine Ratio: 17 (ref 9–20)
BUN: 16 mg/dL (ref 6–24)
Bilirubin Total: 0.8 mg/dL (ref 0.0–1.2)
CO2: 24 mmol/L (ref 20–29)
Calcium: 9.5 mg/dL (ref 8.7–10.2)
Chloride: 98 mmol/L (ref 96–106)
Creatinine, Ser: 0.93 mg/dL (ref 0.76–1.27)
GFR calc Af Amer: 107 mL/min/{1.73_m2} (ref >59)
GFR calc non Af Amer: 93 mL/min/{1.73_m2} (ref >59)
Globulin, Total: 2.4 g/dL (ref 1.5–4.5)
Glucose: 127 mg/dL — ABNORMAL HIGH (ref 65–99)
Potassium: 4.6 mmol/L (ref 3.5–5.2)
Sodium: 139 mmol/L (ref 134–144)
Total Protein: 7 g/dL (ref 6.0–8.5)

## 2019-04-01 LAB — VITAMIN D 25 HYDROXY (VIT D DEFICIENCY, FRACTURES): Vit D, 25-Hydroxy: 24.2 ng/mL — ABNORMAL LOW (ref 30.0–100.0)

## 2019-04-01 LAB — CBC WITH DIFFERENTIAL/PLATELET
Basophils Absolute: 0.1 10*3/uL (ref 0.0–0.2)
Basos: 1 %
EOS (ABSOLUTE): 0.2 10*3/uL (ref 0.0–0.4)
Eos: 4 %
Hematocrit: 48 % (ref 37.5–51.0)
Hemoglobin: 16.5 g/dL (ref 13.0–17.7)
Immature Grans (Abs): 0 10*3/uL (ref 0.0–0.1)
Immature Granulocytes: 0 %
Lymphocytes Absolute: 1.5 10*3/uL (ref 0.7–3.1)
Lymphs: 27 %
MCH: 31.3 pg (ref 26.6–33.0)
MCHC: 34.4 g/dL (ref 31.5–35.7)
MCV: 91 fL (ref 79–97)
Monocytes Absolute: 0.5 10*3/uL (ref 0.1–0.9)
Monocytes: 9 %
Neutrophils Absolute: 3.3 10*3/uL (ref 1.4–7.0)
Neutrophils: 59 %
Platelets: 182 10*3/uL (ref 150–450)
RBC: 5.28 x10E6/uL (ref 4.14–5.80)
RDW: 12.5 % (ref 11.6–15.4)
WBC: 5.6 10*3/uL (ref 3.4–10.8)

## 2019-04-01 LAB — VITAMIN B12: Vitamin B-12: 374 pg/mL (ref 232–1245)

## 2019-04-01 LAB — T4, FREE: Free T4: 1.25 ng/dL (ref 0.82–1.77)

## 2019-04-01 LAB — TSH: TSH: 1.52 u[IU]/mL (ref 0.450–4.500)

## 2019-04-01 LAB — INSULIN, RANDOM: INSULIN: 44.7 u[IU]/mL — ABNORMAL HIGH (ref 2.6–24.9)

## 2019-04-01 LAB — FOLATE: Folate: 10.5 ng/mL (ref >3.0)

## 2019-04-01 LAB — HEMOGLOBIN A1C
Est. average glucose Bld gHb Est-mCnc: 128 mg/dL
Hgb A1c MFr Bld: 6.1 % — ABNORMAL HIGH (ref 4.8–5.6)

## 2019-04-01 LAB — T3: T3, Total: 167 ng/dL (ref 71–180)

## 2019-04-14 ENCOUNTER — Other Ambulatory Visit: Payer: Self-pay

## 2019-04-14 ENCOUNTER — Encounter (INDEPENDENT_AMBULATORY_CARE_PROVIDER_SITE_OTHER): Payer: Self-pay | Admitting: Family Medicine

## 2019-04-14 ENCOUNTER — Ambulatory Visit (INDEPENDENT_AMBULATORY_CARE_PROVIDER_SITE_OTHER): Payer: BC Managed Care – PPO | Admitting: Family Medicine

## 2019-04-14 VITALS — BP 120/76 | HR 76 | Temp 98.3°F | Ht 72.0 in | Wt 288.0 lb

## 2019-04-14 DIAGNOSIS — R7303 Prediabetes: Secondary | ICD-10-CM

## 2019-04-14 DIAGNOSIS — Z9189 Other specified personal risk factors, not elsewhere classified: Secondary | ICD-10-CM | POA: Diagnosis not present

## 2019-04-14 DIAGNOSIS — E559 Vitamin D deficiency, unspecified: Secondary | ICD-10-CM

## 2019-04-14 DIAGNOSIS — R7989 Other specified abnormal findings of blood chemistry: Secondary | ICD-10-CM

## 2019-04-14 DIAGNOSIS — E782 Mixed hyperlipidemia: Secondary | ICD-10-CM | POA: Diagnosis not present

## 2019-04-14 DIAGNOSIS — Z6839 Body mass index (BMI) 39.0-39.9, adult: Secondary | ICD-10-CM

## 2019-04-18 MED ORDER — METFORMIN HCL 500 MG PO TABS: 500.0000 mg | ORAL_TABLET | Freq: Every day | ORAL | 0 refills | Status: DC

## 2019-04-18 MED ORDER — VITAMIN D (ERGOCALCIFEROL) 1.25 MG (50000 UNIT) PO CAPS: 50000.0000 [IU] | ORAL_CAPSULE | ORAL | 0 refills | Status: DC

## 2019-04-18 NOTE — Progress Notes (Signed)
Chief Complaint:   OBESITY Robert Holland is here to discuss his progress with his obesity treatment plan along with follow-up of his obesity related diagnoses. Robert Holland is on the Category 4 Plan and states he is following his eating plan approximately 98.2% of the time. Robert Holland states he is doing 0 minutes 0 times per week.  Today's visit was #: 2 Starting weight: 295 lbs Starting date: 03/31/2019 Today's weight: 288 lbs Today's date: 04/14/2019 Total lbs lost to date: 7 Total lbs lost since last in-office visit: 7  Interim History: Robert Holland has done very well with his weight loss on his Category 4 plan. His hunger is controlled and he is happy with his food choices. He noticed some cravings in the afternoons.  Subjective:   1. Elevated LFTs Robert Holland has a new diagnosis of elevated LFTs. His LFTs  Is mildly elevated, and he has a family history of liver failure in his mother. He denies jaundice or abdominal pain. I discussed labs with the patient today.  2. Vitamin D deficiency Robert Holland has a new diagnosis of Vit D deficiency. He is not on Vit D and he notes fatigue. I discussed labs with the patient today.  3. Mixed hyperlipidemia Robert Holland has a new diagnosis of hyperlipidemia. He is not on statin, and he is attempting to improve with diet and weight loss. He denies chest pain. I discussed labs with the patient today.  4. Pre-diabetes Robert Holland has a new diagnosis of pre-diabetes. His A1c, glucose, and fasting insulin are elevated. He notes decreased polyphagia on his diet prescription. He denies hypoglycemia. I discussed labs with the patient today.  5. At risk for diabetes mellitus Robert Holland is at higher than average risk for developing diabetes due to his obesity.   Assessment/Plan:   1. Elevated LFTs We discussed the likely diagnosis of non-alcoholic fatty liver disease today and how this condition is obesity related. Robert Holland was educated the importance of weight loss. Robert Holland agreed to continue with his weight loss  efforts with healthier diet and exercise as an essential part of his treatment plan. We will recheck labs in 3 months.  2. Vitamin D deficiency Low Vitamin D level contributes to fatigue and are associated with obesity, breast, and colon cancer. Robert Holland agreed to start prescription Vitamin D 50,000 IU every week with no refill. He will follow-up for routine testing of Vitamin D, at least 2-3 times per year to avoid over-replacement. We will recheck labs in 3 months.  - Vitamin D, Ergocalciferol, (DRISDOL) 1.25 MG (50000 UNIT) CAPS capsule; Take 1 capsule (50,000 Units total) by mouth every 7 (seven) days.  Dispense: 4 capsule; Refill: 0  3. Mixed hyperlipidemia Cardiovascular risk and specific lipid/LDL goals reviewed. We discussed several lifestyle modifications today and Anastasia will continue to work on diet, exercise and weight loss efforts. We will recheck labs in 3 months. Orders and follow up as documented in patient record.   4. Pre-diabetes Robert Holland will continue to work on weight loss, exercise, and decreasing simple carbohydrates to help decrease the risk of diabetes. Robert Holland agreed to start metformin 500 mg daily with no refill. We will recheck labs in 3 months.  - metFORMIN (GLUCOPHAGE) 500 MG tablet; Take 1 tablet (500 mg total) by mouth daily with breakfast.  Dispense: 30 tablet; Refill: 0  5. At risk for diabetes mellitus Robert Holland was given approximately 30 minutes of diabetes education and counseling today. We discussed intensive lifestyle modifications today with an emphasis on weight loss as well  as increasing exercise and decreasing simple carbohydrates in his diet. We also reviewed medication options with an emphasis on risk versus benefit of those discussed.   Repetitive spaced learning was employed today to elicit superior memory formation and behavioral change.  6. Class 2 severe obesity with serious comorbidity and body mass index (BMI) of 39.0 to 39.9 in adult, unspecified obesity type  (HCC) Robert Holland is currently in the action stage of change. As such, his goal is to continue with weight loss efforts. He has agreed to the Category 4 Plan.   Behavioral modification strategies: increasing lean protein intake and decreasing simple carbohydrates.  Robert Holland has agreed to follow-up with our clinic in 2 weeks. He was informed of the importance of frequent follow-up visits to maximize his success with intensive lifestyle modifications for his multiple health conditions.   Objective:   Blood pressure 120/76, pulse 76, temperature 98.3 F (36.8 C), temperature source Oral, height 6' (1.829 m), weight 288 lb (130.6 kg), SpO2 97 %. Body mass index is 39.06 kg/m.  General: Cooperative, alert, well developed, in no acute distress. HEENT: Conjunctivae and lids unremarkable. Cardiovascular: Regular rhythm.  Lungs: Normal work of breathing. Neurologic: No focal deficits.   Lab Results  Component Value Date   CREATININE 0.93 03/31/2019   BUN 16 03/31/2019   NA 139 03/31/2019   K 4.6 03/31/2019   CL 98 03/31/2019   CO2 24 03/31/2019   Lab Results  Component Value Date   ALT 87 (H) 03/31/2019   AST 50 (H) 03/31/2019   ALKPHOS 84 03/31/2019   BILITOT 0.8 03/31/2019   Lab Results  Component Value Date   HGBA1C 6.1 (H) 03/31/2019   HGBA1C 5.4 08/17/2017   Lab Results  Component Value Date   INSULIN 44.7 (H) 03/31/2019   Lab Results  Component Value Date   TSH 1.520 03/31/2019   Lab Results  Component Value Date   CHOL 177 03/31/2019   HDL 38 (L) 03/31/2019   LDLCALC 99 03/31/2019   TRIG 235 (H) 03/31/2019   CHOLHDL 4 08/17/2017   Lab Results  Component Value Date   WBC 5.6 03/31/2019   HGB 16.5 03/31/2019   HCT 48.0 03/31/2019   MCV 91 03/31/2019   PLT 182 03/31/2019   No results found for: IRON, TIBC, FERRITIN  Attestation Statements:   Reviewed by clinician on day of visit: allergies, medications, problem list, medical history, surgical history, family  history, social history, and previous encounter notes.   I, Trixie Dredge, am acting as transcriptionist for Dennard Nip, MD.  I have reviewed the above documentation for accuracy and completeness, and I agree with the above. -  Dennard Nip, MD

## 2019-05-02 ENCOUNTER — Ambulatory Visit (INDEPENDENT_AMBULATORY_CARE_PROVIDER_SITE_OTHER): Payer: BC Managed Care – PPO | Admitting: Physician Assistant

## 2019-05-02 ENCOUNTER — Encounter (INDEPENDENT_AMBULATORY_CARE_PROVIDER_SITE_OTHER): Payer: Self-pay | Admitting: Physician Assistant

## 2019-05-02 ENCOUNTER — Other Ambulatory Visit: Payer: Self-pay

## 2019-05-02 VITALS — BP 133/74 | HR 80 | Temp 98.2°F | Ht 72.0 in | Wt 287.0 lb

## 2019-05-02 DIAGNOSIS — E559 Vitamin D deficiency, unspecified: Secondary | ICD-10-CM | POA: Diagnosis not present

## 2019-05-02 DIAGNOSIS — Z6839 Body mass index (BMI) 39.0-39.9, adult: Secondary | ICD-10-CM

## 2019-05-02 DIAGNOSIS — Z9189 Other specified personal risk factors, not elsewhere classified: Secondary | ICD-10-CM | POA: Diagnosis not present

## 2019-05-02 DIAGNOSIS — R7303 Prediabetes: Secondary | ICD-10-CM

## 2019-05-02 MED ORDER — VITAMIN D (ERGOCALCIFEROL) 1.25 MG (50000 UNIT) PO CAPS: 50000.0000 [IU] | ORAL_CAPSULE | ORAL | 0 refills | Status: DC

## 2019-05-02 NOTE — Progress Notes (Signed)
Chief Complaint:   OBESITY Robert Holland is here to discuss his progress with his obesity treatment plan along with follow-up of his obesity related diagnoses. Robert Holland is on the Category 4 Plan and states he is following his eating plan approximately 95% of the time. Kreig states he is walking 30 minutes 2-3 times per week.  Today's visit was #: 3 Starting weight: 295 lbs Starting date: 03/31/2019 Today's weight: 287 lbs Today's date: 05/02/2019 Total lbs lost to date: 8 Total lbs lost since last in-office visit: 1  Interim History: Robert Holland is disappointed with his weight loss today, as he states that he has been following the plan well. He denies excessive hunger. He is not drinking enough water.  Subjective:   Vitamin D deficiency. No nausea, vomiting, or muscle weakness. Elihu is on Vitamin D weekly. Last Vitamin D level was not at goal - 24.2 on 03/31/2019.  Prediabetes. Robert Holland has a diagnosis of prediabetes based on his elevated HgA1c and was informed this puts him at greater risk of developing diabetes. He continues to work on diet and exercise to decrease his risk of diabetes. He denies nausea, vomiting, or diarrhea. No polyphagia. Warrior is on metformin.   Lab Results  Component Value Date   HGBA1C 6.1 (H) 03/31/2019   Lab Results  Component Value Date   INSULIN 44.7 (H) 03/31/2019    At risk for diabetes mellitus. Robert Holland is at higher than average risk for developing diabetes due to his obesity.   Assessment/Plan:   Vitamin D deficiency. Low Vitamin D level contributes to fatigue and are associated with obesity, breast, and colon cancer. He was given a refill on his Vitamin D, Ergocalciferol, (DRISDOL) 1.25 MG (50000 UNIT) CAPS capsule every week #4 with 0 refills and will follow-up for routine testing of Vitamin D, at least 2-3 times per year to avoid over-replacement.    Prediabetes. [name] will continue to work on weight loss, exercise, and decreasing simple carbohydrates to help  decrease the risk of diabetes. He will continue metformin as directed.  At risk for diabetes mellitus. Robert Holland was given approximately 15 minutes of diabetes education and counseling today. We discussed intensive lifestyle modifications today with an emphasis on weight loss as well as increasing exercise and decreasing simple carbohydrates in his diet. We also reviewed medication options with an emphasis on risk versus benefit of those discussed.   Repetitive spaced learning was employed today to elicit superior memory formation and behavioral change.  Class 2 severe obesity with serious comorbidity and body mass index (BMI) of 39.0 to 39.9 in adult, unspecified obesity type ([place]).  Random is currently in the action stage of change. As such, his goal is to continue with weight loss efforts. He has agreed to the Category 4 Plan.   Exercise goals: For substantial health benefits, adults should do at least 150 minutes (2 hours and 30 minutes) a week of moderate-intensity, or 75 minutes (1 hour and 15 minutes) a week of vigorous-intensity aerobic physical activity, or an equivalent combination of moderate- and vigorous-intensity aerobic activity. Aerobic activity should be performed in episodes of at least 10 minutes, and preferably, it should be spread throughout the week.  Behavioral modification strategies: increasing water intake, meal planning and cooking strategies and keeping healthy foods in the home.  Robert Holland has agreed to follow-up with our clinic in 2 weeks. He was informed of the importance of frequent follow-up visits to maximize his success with intensive lifestyle modifications for his  multiple health conditions.   Objective:   Blood pressure 133/74, pulse 80, temperature 98.2 F (36.8 C), temperature source Oral, height 6' (1.829 m), weight 287 lb (130.2 kg), SpO2 98 %. Body mass index is 38.92 kg/m.  General: Cooperative, alert, well developed, in no acute distress. HEENT: Conjunctivae  and lids unremarkable. Cardiovascular: Regular rhythm.  Lungs: Normal work of breathing. Neurologic: No focal deficits.   Lab Results  Component Value Date   CREATININE 0.93 03/31/2019   BUN 16 03/31/2019   NA 139 03/31/2019   K 4.6 03/31/2019   CL 98 03/31/2019   CO2 24 03/31/2019   Lab Results  Component Value Date   ALT 87 (H) 03/31/2019   AST 50 (H) 03/31/2019   ALKPHOS 84 03/31/2019   BILITOT 0.8 03/31/2019   Lab Results  Component Value Date   HGBA1C 6.1 (H) 03/31/2019   HGBA1C 5.4 08/17/2017   Lab Results  Component Value Date   INSULIN 44.7 (H) 03/31/2019   Lab Results  Component Value Date   TSH 1.520 03/31/2019   Lab Results  Component Value Date   CHOL 177 03/31/2019   HDL 38 (L) 03/31/2019   LDLCALC 99 03/31/2019   TRIG 235 (H) 03/31/2019   CHOLHDL 4 08/17/2017   Lab Results  Component Value Date   WBC 5.6 03/31/2019   HGB 16.5 03/31/2019   HCT 48.0 03/31/2019   MCV 91 03/31/2019   PLT 182 03/31/2019   No results found for: IRON, TIBC, FERRITIN  Attestation Statements:   Reviewed by clinician on day of visit: allergies, medications, problem list, medical history, surgical history, family history, social history, and previous encounter notes.  IMichaelene Song, am acting as transcriptionist for Abby Potash, PA-C   I have reviewed the above documentation for accuracy and completeness, and I agree with the above. Abby Potash, PA-C

## 2019-05-13 ENCOUNTER — Other Ambulatory Visit (INDEPENDENT_AMBULATORY_CARE_PROVIDER_SITE_OTHER): Payer: Self-pay | Admitting: Family Medicine

## 2019-05-13 DIAGNOSIS — R7303 Prediabetes: Secondary | ICD-10-CM

## 2019-05-17 ENCOUNTER — Ambulatory Visit (INDEPENDENT_AMBULATORY_CARE_PROVIDER_SITE_OTHER): Payer: BC Managed Care – PPO | Admitting: Physician Assistant

## 2019-05-19 ENCOUNTER — Ambulatory Visit (INDEPENDENT_AMBULATORY_CARE_PROVIDER_SITE_OTHER): Payer: BC Managed Care – PPO | Admitting: Physician Assistant

## 2019-05-19 ENCOUNTER — Other Ambulatory Visit: Payer: Self-pay

## 2019-05-19 ENCOUNTER — Encounter (INDEPENDENT_AMBULATORY_CARE_PROVIDER_SITE_OTHER): Payer: Self-pay | Admitting: Physician Assistant

## 2019-05-19 VITALS — BP 113/73 | HR 75 | Temp 98.0°F | Ht 72.0 in | Wt 280.0 lb

## 2019-05-19 DIAGNOSIS — E559 Vitamin D deficiency, unspecified: Secondary | ICD-10-CM

## 2019-05-19 DIAGNOSIS — R7303 Prediabetes: Secondary | ICD-10-CM

## 2019-05-19 DIAGNOSIS — Z9189 Other specified personal risk factors, not elsewhere classified: Secondary | ICD-10-CM | POA: Diagnosis not present

## 2019-05-19 DIAGNOSIS — Z6838 Body mass index (BMI) 38.0-38.9, adult: Secondary | ICD-10-CM

## 2019-05-19 NOTE — Progress Notes (Signed)
Chief Complaint:   OBESITY Robert Holland is here to discuss his progress with his obesity treatment plan along with follow-up of his obesity related diagnoses. Robert Holland is on the Category 4 Plan and states he is following his eating plan approximately 80% of the time. Robert Holland states he is exercising 0 minutes 0 times per week.  Today's visit was #: 4 Starting weight: 295 lbs Starting date: 03/31/2019 Today's weight: 280 lbs Today's date: 05/19/2019 Total lbs lost to date: 15 Total lbs lost since last in-office visit: 7  Interim History: Robert Holland did very well with weight loss. He notes some hunger between breakfast and lunch and lunch and dinner. His hunger is mostly resolved with snacks. He is not drinking enough water.  Subjective:   Prediabetes. Robert Holland has a diagnosis of prediabetes based on his elevated HgA1c and was informed this puts him at greater risk of developing diabetes. He continues to work on diet and exercise to decrease his risk of diabetes. He denies nausea, vomiting, or diarrhea. No polyphagia. Robert Holland is on metformin.   Lab Results  Component Value Date   HGBA1C 6.1 (H) 03/31/2019   Lab Results  Component Value Date   INSULIN 44.7 (H) 03/31/2019   Vitamin D deficiency. Robert Holland is on Vitamin D. No nausea, vomiting, or muscle weakness. Last Vitamin D 24.2 on 03/31/2019.  At risk for osteoporosis. Robert Holland is at higher risk of osteopenia and osteoporosis due to Vitamin D deficiency.   Assessment/Plan:   Prediabetes. Robert Holland will continue to work on weight loss, exercise, and decreasing simple carbohydrates to help decrease the risk of diabetes. He will continue his medication as directed.  Vitamin D deficiency. Low Vitamin D level contributes to fatigue and are associated with obesity, breast, and colon cancer. He agrees to continue to take Vitamin D and will follow-up for routine testing of Vitamin D, at least 2-3 times per year to avoid over-replacement.  At risk for osteoporosis.  Robert Holland was given approximately 15 minutes of osteoporosis prevention counseling today. Robert Holland is at risk for osteopenia and osteoporosis due to his Vitamin D deficiency. He was encouraged to take his Vitamin D and follow his higher calcium diet and increase strengthening exercise to help strengthen his bones and decrease his risk of osteopenia and osteoporosis.  Repetitive spaced learning was employed today to elicit superior memory formation and behavioral change.  Class 2 severe obesity with serious comorbidity and body mass index (BMI) of 38.0 to 38.9 in adult, unspecified obesity type (Robert Holland).  Robert Holland is currently in the action stage of change. As such, his goal is to continue with weight loss efforts. He has agreed to the Category 4 Plan.   Exercise goals: For substantial health benefits, adults should do at least 150 minutes (2 hours and 30 minutes) a week of moderate-intensity, or 75 minutes (1 hour and 15 minutes) a week of vigorous-intensity aerobic physical activity, or an equivalent combination of moderate- and vigorous-intensity aerobic activity. Aerobic activity should be performed in episodes of at least 10 minutes, and preferably, it should be spread throughout the week.  Behavioral modification strategies: meal planning and cooking strategies and keeping healthy foods in the home.  Robert Holland has agreed to follow-up with our clinic in 2 weeks. He was informed of the importance of frequent follow-up visits to maximize his success with intensive lifestyle modifications for his multiple health conditions.   Objective:   Blood pressure 113/73, pulse 75, temperature 98 F (36.7 C), temperature source Oral, height  6' (1.829 m), weight 280 lb (127 kg), SpO2 98 %. Body mass index is 37.97 kg/m.  General: Cooperative, alert, well developed, in no acute distress. HEENT: Conjunctivae and lids unremarkable. Cardiovascular: Regular rhythm.  Lungs: Normal work of breathing. Neurologic: No focal  deficits.   Lab Results  Component Value Date   CREATININE 0.93 03/31/2019   BUN 16 03/31/2019   NA 139 03/31/2019   K 4.6 03/31/2019   CL 98 03/31/2019   CO2 24 03/31/2019   Lab Results  Component Value Date   ALT 87 (H) 03/31/2019   AST 50 (H) 03/31/2019   ALKPHOS 84 03/31/2019   BILITOT 0.8 03/31/2019   Lab Results  Component Value Date   HGBA1C 6.1 (H) 03/31/2019   HGBA1C 5.4 08/17/2017   Lab Results  Component Value Date   INSULIN 44.7 (H) 03/31/2019   Lab Results  Component Value Date   TSH 1.520 03/31/2019   Lab Results  Component Value Date   CHOL 177 03/31/2019   HDL 38 (L) 03/31/2019   LDLCALC 99 03/31/2019   TRIG 235 (H) 03/31/2019   CHOLHDL 4 08/17/2017   Lab Results  Component Value Date   WBC 5.6 03/31/2019   HGB 16.5 03/31/2019   HCT 48.0 03/31/2019   MCV 91 03/31/2019   PLT 182 03/31/2019   No results found for: IRON, TIBC, FERRITIN  Attestation Statements:   Reviewed by clinician on day of visit: allergies, medications, problem list, medical history, surgical history, family history, social history, and previous encounter notes.  IMichaelene Song, am acting as transcriptionist for Abby Potash, PA-C   I have reviewed the above documentation for accuracy and completeness, and I agree with the above. Abby Potash, PA-C

## 2019-06-02 ENCOUNTER — Other Ambulatory Visit (INDEPENDENT_AMBULATORY_CARE_PROVIDER_SITE_OTHER): Payer: Self-pay | Admitting: Family Medicine

## 2019-06-02 ENCOUNTER — Encounter (INDEPENDENT_AMBULATORY_CARE_PROVIDER_SITE_OTHER): Payer: Self-pay | Admitting: Physician Assistant

## 2019-06-02 ENCOUNTER — Other Ambulatory Visit (INDEPENDENT_AMBULATORY_CARE_PROVIDER_SITE_OTHER): Payer: Self-pay | Admitting: Physician Assistant

## 2019-06-02 DIAGNOSIS — R7303 Prediabetes: Secondary | ICD-10-CM

## 2019-06-02 DIAGNOSIS — E559 Vitamin D deficiency, unspecified: Secondary | ICD-10-CM

## 2019-06-08 ENCOUNTER — Encounter (INDEPENDENT_AMBULATORY_CARE_PROVIDER_SITE_OTHER): Payer: Self-pay | Admitting: Physician Assistant

## 2019-06-08 ENCOUNTER — Ambulatory Visit (INDEPENDENT_AMBULATORY_CARE_PROVIDER_SITE_OTHER): Payer: BC Managed Care – PPO | Admitting: Physician Assistant

## 2019-06-08 ENCOUNTER — Other Ambulatory Visit: Payer: Self-pay

## 2019-06-08 VITALS — BP 129/77 | HR 78 | Temp 98.2°F | Ht 72.0 in | Wt 281.0 lb

## 2019-06-08 DIAGNOSIS — E559 Vitamin D deficiency, unspecified: Secondary | ICD-10-CM | POA: Diagnosis not present

## 2019-06-08 DIAGNOSIS — Z6838 Body mass index (BMI) 38.0-38.9, adult: Secondary | ICD-10-CM | POA: Diagnosis not present

## 2019-06-08 NOTE — Progress Notes (Signed)
Chief Complaint:   OBESITY Robert Holland is here to discuss his progress with his obesity treatment plan along with follow-up of his obesity related diagnoses. Robert Holland is on the Category 4 Plan and states he is following his eating plan approximately 80% of the time. Chai states he is exercising 0 minutes 0 times per week.  Today's visit was #: 5 Starting weight: 295 lbs Starting date: 03/31/2019 Today's weight: 281 lbs Today's date: 06/08/2019 Total lbs lost to date: 14 Total lbs lost since last in-office visit: 0  Interim History: Robert Holland reports that he got off track with the plan at times due to boredom with dinner. He is drinking more water.  Subjective:   Vitamin D deficiency. Robert Holland is on Vitamin D. No nausea, vomiting, or muscle weakness. Last Vitamin D 24.2 on 03/31/2019.  Assessment/Plan:   Vitamin D deficiency. Low Vitamin D level contributes to fatigue and are associated with obesity, breast, and colon cancer. He agrees to continue to take Vitamin D and will follow-up for routine testing of Vitamin D, at least 2-3 times per year to avoid over-replacement.  Class 2 severe obesity with serious comorbidity and body mass index (BMI) of 38.0 to 38.9 in adult, unspecified obesity type (Robert Holland).  Robert Holland is currently in the action stage of change. As such, his goal is to continue with weight loss efforts. He has agreed to the Category 4 Plan and will journal 550-700 calories and 45 grams of protein at supper.   Exercise goals: For substantial health benefits, adults should do at least 150 minutes (2 hours and 30 minutes) a week of moderate-intensity, or 75 minutes (1 hour and 15 minutes) a week of vigorous-intensity aerobic physical activity, or an equivalent combination of moderate- and vigorous-intensity aerobic activity. Aerobic activity should be performed in episodes of at least 10 minutes, and preferably, it should be spread throughout the week.  Behavioral modification strategies:  meal planning and cooking strategies and keeping healthy foods in the home.  Robert Holland has agreed to follow-up with our clinic in 3 weeks. He was informed of the importance of frequent follow-up visits to maximize his success with intensive lifestyle modifications for his multiple health conditions.   Objective:   Blood pressure 129/77, pulse 78, temperature 98.2 F (36.8 C), temperature source Oral, height 6' (1.829 m), weight 281 lb (127.5 kg), SpO2 98 %. Body mass index is 38.11 kg/m.  General: Cooperative, alert, well developed, in no acute distress. HEENT: Conjunctivae and lids unremarkable. Cardiovascular: Regular rhythm.  Lungs: Normal work of breathing. Neurologic: No focal deficits.   Lab Results  Component Value Date   CREATININE 0.93 03/31/2019   BUN 16 03/31/2019   NA 139 03/31/2019   K 4.6 03/31/2019   CL 98 03/31/2019   CO2 24 03/31/2019   Lab Results  Component Value Date   ALT 87 (H) 03/31/2019   AST 50 (H) 03/31/2019   ALKPHOS 84 03/31/2019   BILITOT 0.8 03/31/2019   Lab Results  Component Value Date   HGBA1C 6.1 (H) 03/31/2019   HGBA1C 5.4 08/17/2017   Lab Results  Component Value Date   INSULIN 44.7 (H) 03/31/2019   Lab Results  Component Value Date   TSH 1.520 03/31/2019   Lab Results  Component Value Date   CHOL 177 03/31/2019   HDL 38 (L) 03/31/2019   LDLCALC 99 03/31/2019   TRIG 235 (H) 03/31/2019   CHOLHDL 4 08/17/2017   Lab Results  Component Value Date  WBC 5.6 03/31/2019   HGB 16.5 03/31/2019   HCT 48.0 03/31/2019   MCV 91 03/31/2019   PLT 182 03/31/2019   No results found for: IRON, TIBC, FERRITIN  Attestation Statements:   Reviewed by clinician on day of visit: allergies, medications, problem list, medical history, surgical history, family history, social history, and previous encounter notes.  Time spent on visit including pre-visit chart review and post-visit charting and care was 30 minutes.   IMichaelene Song, am acting  as transcriptionist for Abby Potash, PA-C   I have reviewed the above documentation for accuracy and completeness, and I agree with the above. Abby Potash, PA-C

## 2019-06-15 ENCOUNTER — Encounter (INDEPENDENT_AMBULATORY_CARE_PROVIDER_SITE_OTHER): Payer: Self-pay | Admitting: Physician Assistant

## 2019-06-15 DIAGNOSIS — E559 Vitamin D deficiency, unspecified: Secondary | ICD-10-CM

## 2019-06-15 MED ORDER — VITAMIN D (ERGOCALCIFEROL) 1.25 MG (50000 UNIT) PO CAPS: 50000.0000 [IU] | ORAL_CAPSULE | ORAL | 0 refills | Status: DC

## 2019-06-29 ENCOUNTER — Encounter (INDEPENDENT_AMBULATORY_CARE_PROVIDER_SITE_OTHER): Payer: Self-pay | Admitting: Physician Assistant

## 2019-06-29 ENCOUNTER — Other Ambulatory Visit: Payer: Self-pay

## 2019-06-29 ENCOUNTER — Ambulatory Visit (INDEPENDENT_AMBULATORY_CARE_PROVIDER_SITE_OTHER): Payer: BC Managed Care – PPO | Admitting: Physician Assistant

## 2019-06-29 VITALS — BP 123/78 | HR 75 | Temp 98.1°F | Ht 72.0 in | Wt 277.0 lb

## 2019-06-29 DIAGNOSIS — R7303 Prediabetes: Secondary | ICD-10-CM

## 2019-06-29 DIAGNOSIS — E7849 Other hyperlipidemia: Secondary | ICD-10-CM

## 2019-06-29 DIAGNOSIS — Z6837 Body mass index (BMI) 37.0-37.9, adult: Secondary | ICD-10-CM

## 2019-06-30 NOTE — Progress Notes (Signed)
Chief Complaint:   OBESITY Robert Holland is here to discuss his progress with his obesity treatment plan along with follow-up of his obesity related diagnoses. Robert Holland is on the Category 4 Plan and journaling supper and states he is following his eating plan approximately 80% of the time. Robert Holland states he is walking 60 minutes per week.   Today's visit was #: 6 Starting weight: 295 lbs Starting date: 03/31/2019 Today's weight: 277 lbs Today's date: 06/29/2019 Total lbs lost to date: 18 Total lbs lost since last in-office visit: 4  Interim History: Daly states that he is enjoying keeping track of protein and calories for dinner, as it gives him more freedom. He is doing well with staying on plan for breakfast and lunch.  Subjective:   Prediabetes. Robert Holland has a diagnosis of prediabetes based on his elevated HgA1c and was informed this puts him at greater risk of developing diabetes. He continues to work on diet and exercise to decrease his risk of diabetes. He denies nausea, vomiting, or diarrhea. No polyphagia. Robert Holland is on metformin.   Lab Results  Component Value Date   HGBA1C 6.1 (H) 03/31/2019   Lab Results  Component Value Date   INSULIN 44.7 (H) 03/31/2019   Other hyperlipidemia. Robert Holland is on no medication. No chest pain. He is walking for exercise.   Lab Results  Component Value Date   CHOL 177 03/31/2019   HDL 38 (L) 03/31/2019   LDLCALC 99 03/31/2019   TRIG 235 (H) 03/31/2019   CHOLHDL 4 08/17/2017   Lab Results  Component Value Date   ALT 87 (H) 03/31/2019   AST 50 (H) 03/31/2019   ALKPHOS 84 03/31/2019   BILITOT 0.8 03/31/2019   The 10-year ASCVD risk score Robert Bussing DC Jr., et al., 2013) is: 5.3%   Values used to calculate the score:     Age: 53 years     Sex: Male     Is Non-Hispanic African American: No     Diabetic: No     Tobacco smoker: No     Systolic Blood Pressure: [id number]     Is BP treated: No     HDL Cholesterol: 38 mg/dL     Total Cholesterol: 177  mg/dL  Assessment/Plan:   Prediabetes. Robert Holland will continue to work on weight loss, exercise, and decreasing simple carbohydrates to help decrease the risk of diabetes. He will continue his medication as directed.  Other hyperlipidemia. Cardiovascular risk and specific lipid/LDL goals reviewed.  We discussed several lifestyle modifications today and Robert Holland will continue to work on diet, exercise and weight loss efforts. Orders and follow up as documented in patient record.   Counseling Intensive lifestyle modifications are the first line treatment for this issue. . Dietary changes: Increase soluble fiber. Decrease simple carbohydrates. . Exercise changes: Moderate to vigorous-intensity aerobic activity 150 minutes per week if tolerated. . Lipid-lowering medications: see documented in medical record.  Class 2 severe obesity with serious comorbidity and body mass index (BMI) of 37.0 to 37.9 in adult, unspecified obesity type ([place]).  Robert Holland is currently in the action stage of change. As such, his goal is to continue with weight loss efforts. He has agreed to the Category 4 Plan and will journal 550-700 calories + 45 grams of protein at supper.   Exercise goals: For substantial health benefits, adults should do at least 150 minutes (2 hours and 30 minutes) a week of moderate-intensity, or 75 minutes (1 hour and 15 minutes) a  week of vigorous-intensity aerobic physical activity, or an equivalent combination of moderate- and vigorous-intensity aerobic activity. Aerobic activity should be performed in episodes of at least 10 minutes, and preferably, it should be spread throughout the week.  Behavioral modification strategies: meal planning and cooking strategies, travel eating strategies and planning for success.  Robert Holland has agreed to follow-up with our clinic in 3 weeks. He was informed of the importance of frequent follow-up visits to maximize his success with intensive lifestyle modifications for his  multiple health conditions.   Objective:   Blood pressure 123/78, pulse 75, temperature 98.1 F (36.7 C), temperature source Oral, height 6' (1.829 m), weight 277 lb (125.6 kg), SpO2 99 %. Body mass index is 37.57 kg/m.  General: Cooperative, alert, well developed, in no acute distress. HEENT: Conjunctivae and lids unremarkable. Cardiovascular: Regular rhythm.  Lungs: Normal work of breathing. Neurologic: No focal deficits.   Lab Results  Component Value Date   CREATININE 0.93 03/31/2019   BUN 16 03/31/2019   NA 139 03/31/2019   K 4.6 03/31/2019   CL 98 03/31/2019   CO2 24 03/31/2019   Lab Results  Component Value Date   ALT 87 (H) 03/31/2019   AST 50 (H) 03/31/2019   ALKPHOS 84 03/31/2019   BILITOT 0.8 03/31/2019   Lab Results  Component Value Date   HGBA1C 6.1 (H) 03/31/2019   HGBA1C 5.4 08/17/2017   Lab Results  Component Value Date   INSULIN 44.7 (H) 03/31/2019   Lab Results  Component Value Date   TSH 1.520 03/31/2019   Lab Results  Component Value Date   CHOL 177 03/31/2019   HDL 38 (L) 03/31/2019   LDLCALC 99 03/31/2019   TRIG 235 (H) 03/31/2019   CHOLHDL 4 08/17/2017   Lab Results  Component Value Date   WBC 5.6 03/31/2019   HGB 16.5 03/31/2019   HCT 48.0 03/31/2019   MCV 91 03/31/2019   PLT 182 03/31/2019   No results found for: IRON, TIBC, FERRITIN  Attestation Statements:   Reviewed by clinician on day of visit: allergies, medications, problem list, medical history, surgical history, family history, social history, and previous encounter notes.  Time spent on visit including pre-visit chart review and post-visit charting and care was 30 minutes.   IMichaelene Song, am acting as transcriptionist for Abby Potash, PA-C   I have reviewed the above documentation for accuracy and completeness, and I agree with the above. Abby Potash, PA-C

## 2019-07-11 ENCOUNTER — Encounter (INDEPENDENT_AMBULATORY_CARE_PROVIDER_SITE_OTHER): Payer: Self-pay | Admitting: Physician Assistant

## 2019-07-13 ENCOUNTER — Other Ambulatory Visit (INDEPENDENT_AMBULATORY_CARE_PROVIDER_SITE_OTHER): Payer: Self-pay | Admitting: Physician Assistant

## 2019-07-13 DIAGNOSIS — E559 Vitamin D deficiency, unspecified: Secondary | ICD-10-CM

## 2019-07-20 ENCOUNTER — Ambulatory Visit (INDEPENDENT_AMBULATORY_CARE_PROVIDER_SITE_OTHER): Payer: BC Managed Care – PPO | Admitting: Physician Assistant

## 2019-07-20 ENCOUNTER — Other Ambulatory Visit: Payer: Self-pay

## 2019-07-20 ENCOUNTER — Encounter (INDEPENDENT_AMBULATORY_CARE_PROVIDER_SITE_OTHER): Payer: Self-pay | Admitting: Family Medicine

## 2019-07-20 ENCOUNTER — Ambulatory Visit (INDEPENDENT_AMBULATORY_CARE_PROVIDER_SITE_OTHER): Payer: BC Managed Care – PPO | Admitting: Family Medicine

## 2019-07-20 VITALS — BP 133/81 | HR 58 | Temp 98.4°F | Ht 72.0 in | Wt 279.0 lb

## 2019-07-20 DIAGNOSIS — E119 Type 2 diabetes mellitus without complications: Secondary | ICD-10-CM

## 2019-07-20 DIAGNOSIS — E7849 Other hyperlipidemia: Secondary | ICD-10-CM | POA: Diagnosis not present

## 2019-07-20 DIAGNOSIS — Z6837 Body mass index (BMI) 37.0-37.9, adult: Secondary | ICD-10-CM

## 2019-07-20 DIAGNOSIS — R7989 Other specified abnormal findings of blood chemistry: Secondary | ICD-10-CM | POA: Diagnosis not present

## 2019-07-20 DIAGNOSIS — E559 Vitamin D deficiency, unspecified: Secondary | ICD-10-CM

## 2019-07-20 DIAGNOSIS — Z9189 Other specified personal risk factors, not elsewhere classified: Secondary | ICD-10-CM | POA: Diagnosis not present

## 2019-07-20 MED ORDER — VITAMIN D (ERGOCALCIFEROL) 1.25 MG (50000 UNIT) PO CAPS: ORAL_CAPSULE | ORAL | 0 refills | Status: DC

## 2019-07-20 NOTE — Progress Notes (Signed)
Chief Complaint:   OBESITY Robert Holland is here to discuss his progress with his obesity treatment plan along with follow-up of his obesity related diagnoses. Robert Holland is on the Category 4 Plan and keeping a food journal and adhering to recommended goals of 550-700 calories and 45 grams of protein at supper and states he is following his eating plan approximately 75% of the time. Robert Holland states he is walking for 60 minutes 1 times per week.  Today's visit was #: 7 Starting weight: 295 lbs Starting date: 03/31/2019 Today's weight: 279 lbs Today's date: 07/20/2019 Total lbs lost to date: 16 lbs Total lbs lost since last in-office visit: 0  Interim History: Robert Holland did some celebration eating at a buffet last weekend.  Subjective:   1. Vitamin D deficiency Robert Holland's Vitamin D level was 24.2 on 03/31/2019. He is currently taking prescription vitamin D 50,000 IU each week. He denies nausea, vomiting or muscle weakness.  2. Elevated LFTs  Lab Results  Component Value Date   ALT 87 (H) 03/31/2019   AST 50 (H) 03/31/2019   ALKPHOS 84 03/31/2019   BILITOT 0.8 03/31/2019   3. Other hyperlipidemia with DM Robert Holland has hyperlipidemia and has been trying to improve his cholesterol levels with intensive lifestyle modification including a low saturated fat diet, exercise and weight loss. He denies any chest pain, claudication or myalgias.  Lab Results  Component Value Date   ALT 87 (H) 03/31/2019   AST 50 (H) 03/31/2019   ALKPHOS 84 03/31/2019   BILITOT 0.8 03/31/2019   Lab Results  Component Value Date   CHOL 177 03/31/2019   HDL 38 (L) 03/31/2019   LDLCALC 99 03/31/2019   TRIG 235 (H) 03/31/2019   CHOLHDL 4 08/17/2017   4. Controlled type 2 diabetes mellitus without complication, without long-term current use of insulin (HCC) Medications reviewed. Diabetic ROS: no polyuria or polydipsia, no chest pain, dyspnea or TIA's, no numbness, tingling or pain in extremities.   Lab Results  Component Value  Date   HGBA1C 6.1 (H) 03/31/2019   HGBA1C 5.4 08/17/2017   Lab Results  Component Value Date   LDLCALC 99 03/31/2019   CREATININE 0.93 03/31/2019   Lab Results  Component Value Date   INSULIN 44.7 (H) 03/31/2019   5. At risk for heart disease  The 10-year ASCVD risk score Robert Holland DC Brooke Bonito., et al., 2013) is: 12.6%   Values used to calculate the score:     Age: 54 years     Sex: Male     Is Non-Hispanic African American: No     Diabetic: Yes     Tobacco smoker: No     Systolic Blood Pressure: [id number] mmHg     Is BP treated: No     HDL Cholesterol: 39 mg/dL     Total Cholesterol: 198 mg/dL  Assessment/Plan:   1. Vitamin D deficiency Low Vitamin D level contributes to fatigue and are associated with obesity, breast, and colon cancer. He agrees to continue to take prescription Vitamin D @50 ,000 IU every week and will follow-up for routine testing of Vitamin D, at least 2-3 times per year to avoid over-replacement.  Orders - VITAMIN D 25 Hydroxy (Vit-D Deficiency, Fractures) - Vitamin D, Ergocalciferol, (DRISDOL) 1.25 MG (50000 UNIT) CAPS capsule; TAKE 1 CAPSULE BY MOUTH TAKE 1 TAB BY MOUTH ONCE A WEEK  Dispense: 4 capsule; Refill: 0  2. Elevated LFTs We discussed the likely diagnosis of non-alcoholic fatty liver disease today and how  this condition is obesity related. [name] was educated the importance of weight loss. Robert Holland agreed to continue with his weight loss efforts with healthier diet and exercise as an essential part of his treatment plan.  Orders - Comprehensive metabolic panel  3. Other hyperlipidemia with DM Cardiovascular risk and specific lipid/LDL goals reviewed.  We discussed several lifestyle modifications today and [name] to work on diet, exercise and weight loss efforts. Orders and follow up as documented in patient record.   Counseling Intensive lifestyle modifications are the first line treatment for this issue. . Dietary changes: Increase soluble fiber.  Decrease simple carbohydrates. . Exercise changes: Moderate to vigorous-intensity aerobic activity 150 minutes per week if tolerated. . Lipid-lowering medications: see documented in medical record.  Orders - Lipid panel - Vitamin B12  4. Controlled type 2 diabetes mellitus without complication, without long-term current use of insulin (HCC) Good blood sugar control is important to decrease the likelihood of diabetic complications such as nephropathy, neuropathy, limb loss, blindness, coronary artery disease, and death. Intensive lifestyle modification including diet, exercise and weight loss are the first line of treatment for diabetes.   Orders - Hemoglobin A1c  5. At risk for heart disease Robert Holland was given approximately 15 minutes of diabetes education and counseling today. We discussed intensive lifestyle modifications today with an emphasis on weight loss as well as increasing exercise and decreasing simple carbohydrates in his diet. We also reviewed medication options with an emphasis on risk versus benefit of those discussed.   Repetitive spaced learning was employed today to elicit superior memory formation and behavioral change.  6. Class 2 severe obesity with serious comorbidity and body mass index (BMI) of 37.0 to 37.9 in adult, unspecified obesity type (HCC) Robert Holland is currently in the action stage of change. As such, his goal is to continue with weight loss efforts. He has agreed to the Category 4 Plan.   Exercise goals: For substantial health benefits, adults should do at least 150 minutes (2 hours and 30 minutes) a week of moderate-intensity, or 75 minutes (1 hour and 15 minutes) a week of vigorous-intensity aerobic physical activity, or an equivalent combination of moderate- and vigorous-intensity aerobic activity. Aerobic activity should be performed in episodes of at least 10 minutes, and preferably, it should be spread throughout the week.  Behavioral modification strategies:  increasing lean protein intake and increasing water intake.  Robert Holland has agreed to follow-up with our clinic in 2 weeks. He was informed of the importance of frequent follow-up visits to maximize his success with intensive lifestyle modifications for his multiple health conditions.   Robert Holland was informed we would discuss his lab results at his next visit unless there is a critical issue that needs to be addressed sooner. Robert Holland agreed to keep his next visit at the agreed upon time to discuss these results.  Objective:   Blood pressure 133/81, pulse (!) 58, temperature 98.4 F (36.9 C), temperature source Oral, height 6' (1.829 m), weight 279 lb (126.6 kg), SpO2 98 %. Body mass index is 37.84 kg/m.  General: Cooperative, alert, well developed, in no acute distress. HEENT: Conjunctivae and lids unremarkable. Cardiovascular: Regular rhythm.  Lungs: Normal work of breathing. Neurologic: No focal deficits.   Lab Results  Component Value Date   CREATININE 0.93 03/31/2019   BUN 16 03/31/2019   NA 139 03/31/2019   K 4.6 03/31/2019   CL 98 03/31/2019   CO2 24 03/31/2019   Lab Results  Component Value Date  ALT 87 (H) 03/31/2019   AST 50 (H) 03/31/2019   ALKPHOS 84 03/31/2019   BILITOT 0.8 03/31/2019   Lab Results  Component Value Date   HGBA1C 6.1 (H) 03/31/2019   HGBA1C 5.4 08/17/2017   Lab Results  Component Value Date   INSULIN 44.7 (H) 03/31/2019   Lab Results  Component Value Date   TSH 1.520 03/31/2019   Lab Results  Component Value Date   CHOL 177 03/31/2019   HDL 38 (L) 03/31/2019   LDLCALC 99 03/31/2019   TRIG 235 (H) 03/31/2019   CHOLHDL 4 08/17/2017   Lab Results  Component Value Date   WBC 5.6 03/31/2019   HGB 16.5 03/31/2019   HCT 48.0 03/31/2019   MCV 91 03/31/2019   PLT 182 03/31/2019   Attestation Statements:   Reviewed by clinician on day of visit: allergies, medications, problem list, medical history, surgical history, family history, social  history, and previous encounter notes.  I, Water quality scientist, CMA, am acting as transcriptionist for Briscoe Deutscher, DO  I have reviewed the above documentation for accuracy and completeness, and I agree with the above. Briscoe Deutscher, DO

## 2019-07-21 LAB — CBC WITH DIFFERENTIAL/PLATELET
Basophils Absolute: 0.1 10*3/uL (ref 0.0–0.2)
Basos: 1 %
EOS (ABSOLUTE): 0.3 10*3/uL (ref 0.0–0.4)
Eos: 4 %
Hematocrit: 51.5 % — ABNORMAL HIGH (ref 37.5–51.0)
Hemoglobin: 17.5 g/dL (ref 13.0–17.7)
Immature Grans (Abs): 0 10*3/uL (ref 0.0–0.1)
Immature Granulocytes: 0 %
Lymphocytes Absolute: 2 10*3/uL (ref 0.7–3.1)
Lymphs: 27 %
MCH: 30.5 pg (ref 26.6–33.0)
MCHC: 34 g/dL (ref 31.5–35.7)
MCV: 90 fL (ref 79–97)
Monocytes Absolute: 0.6 10*3/uL (ref 0.1–0.9)
Monocytes: 8 %
Neutrophils Absolute: 4.4 10*3/uL (ref 1.4–7.0)
Neutrophils: 60 %
Platelets: 224 10*3/uL (ref 150–450)
RBC: 5.74 x10E6/uL (ref 4.14–5.80)
RDW: 12.2 % (ref 11.6–15.4)
WBC: 7.3 10*3/uL (ref 3.4–10.8)

## 2019-07-21 LAB — COMPREHENSIVE METABOLIC PANEL
ALT: 70 IU/L — ABNORMAL HIGH (ref 0–44)
AST: 44 IU/L — ABNORMAL HIGH (ref 0–40)
Albumin/Globulin Ratio: 2 (ref 1.2–2.2)
Albumin: 5 g/dL — ABNORMAL HIGH (ref 3.8–4.9)
Alkaline Phosphatase: 84 IU/L (ref 48–121)
BUN/Creatinine Ratio: 14 (ref 9–20)
BUN: 14 mg/dL (ref 6–24)
Bilirubin Total: 0.9 mg/dL (ref 0.0–1.2)
CO2: 26 mmol/L (ref 20–29)
Calcium: 10 mg/dL (ref 8.7–10.2)
Chloride: 98 mmol/L (ref 96–106)
Creatinine, Ser: 0.99 mg/dL (ref 0.76–1.27)
GFR calc Af Amer: 99 mL/min/{1.73_m2} (ref >59)
GFR calc non Af Amer: 86 mL/min/{1.73_m2} (ref >59)
Globulin, Total: 2.5 g/dL (ref 1.5–4.5)
Glucose: 93 mg/dL (ref 65–99)
Potassium: 4.2 mmol/L (ref 3.5–5.2)
Sodium: 140 mmol/L (ref 134–144)
Total Protein: 7.5 g/dL (ref 6.0–8.5)

## 2019-07-21 LAB — LIPID PANEL
Chol/HDL Ratio: 5.1 ratio — ABNORMAL HIGH (ref 0.0–5.0)
Cholesterol, Total: 198 mg/dL (ref 100–199)
HDL: 39 mg/dL — ABNORMAL LOW (ref >39)
LDL Chol Calc (NIH): 112 mg/dL — ABNORMAL HIGH (ref 0–99)
Triglycerides: 269 mg/dL — ABNORMAL HIGH (ref 0–149)
VLDL Cholesterol Cal: 47 mg/dL — ABNORMAL HIGH (ref 5–40)

## 2019-07-21 LAB — HEMOGLOBIN A1C
Est. average glucose Bld gHb Est-mCnc: 108 mg/dL
Hgb A1c MFr Bld: 5.4 % (ref 4.8–5.6)

## 2019-07-21 LAB — VITAMIN B12: Vitamin B-12: 347 pg/mL (ref 232–1245)

## 2019-07-21 LAB — VITAMIN D 25 HYDROXY (VIT D DEFICIENCY, FRACTURES): Vit D, 25-Hydroxy: 36.8 ng/mL (ref 30.0–100.0)

## 2019-08-10 ENCOUNTER — Ambulatory Visit (INDEPENDENT_AMBULATORY_CARE_PROVIDER_SITE_OTHER): Payer: BC Managed Care – PPO | Admitting: Physician Assistant

## 2019-08-10 ENCOUNTER — Encounter (INDEPENDENT_AMBULATORY_CARE_PROVIDER_SITE_OTHER): Payer: Self-pay | Admitting: Physician Assistant

## 2019-08-10 ENCOUNTER — Other Ambulatory Visit: Payer: Self-pay

## 2019-08-10 VITALS — BP 118/75 | HR 75 | Temp 98.2°F | Ht 72.0 in | Wt 274.0 lb

## 2019-08-10 DIAGNOSIS — Z9189 Other specified personal risk factors, not elsewhere classified: Secondary | ICD-10-CM

## 2019-08-10 DIAGNOSIS — E559 Vitamin D deficiency, unspecified: Secondary | ICD-10-CM | POA: Diagnosis not present

## 2019-08-10 DIAGNOSIS — R7303 Prediabetes: Secondary | ICD-10-CM | POA: Diagnosis not present

## 2019-08-10 DIAGNOSIS — Z6837 Body mass index (BMI) 37.0-37.9, adult: Secondary | ICD-10-CM

## 2019-08-10 MED ORDER — VITAMIN D (ERGOCALCIFEROL) 1.25 MG (50000 UNIT) PO CAPS: ORAL_CAPSULE | ORAL | 0 refills | Status: DC

## 2019-08-10 MED ORDER — METFORMIN HCL 500 MG PO TABS: 500.0000 mg | ORAL_TABLET | Freq: Every day | ORAL | 0 refills | Status: DC

## 2019-08-11 NOTE — Progress Notes (Signed)
Chief Complaint:   OBESITY Robert Holland is here to discuss his progress with his obesity treatment plan along with follow-up of his obesity related diagnoses. Robert Holland is on the Category 4 Plan and states he is following his eating plan approximately 80% of the time. Robert Holland states he is exercising for 0 minutes 0 times per week.  Today's visit was #: 8 Starting weight: 295 lbs Starting date: 03/31/2019 Today's weight: 274 lbs Today's date: 08/10/2019 Total lbs lost to date: 21 lbs Total lbs lost since last in-office visit: 5 lbs  Interim History: Robert Holland states that he did well getting back on plan after his family reunion.  He is doing well overall and has not had any struggles with the plan.  Subjective:   1. Vitamin D deficiency Robert Holland Vitamin D level was 36.8 on 07/20/2019. He is currently taking prescription vitamin D 50,000 IU each week. He denies nausea, vomiting or muscle weakness.  2. Prediabetes Robert Holland has a diagnosis of prediabetes based on his elevated HgA1c and was informed this puts him at greater risk of developing diabetes. He continues to work on diet and exercise to decrease his risk of diabetes. He denies hypoglycemia.  He is taking metformin with no nausea, vomiting, diarrhea, or polyphagia.  Lab Results  Component Value Date   HGBA1C 5.4 07/20/2019   Lab Results  Component Value Date   INSULIN 44.7 (H) 03/31/2019   3. At risk for diabetes mellitus Robert Holland is at higher than average risk for developing diabetes due to his obesity.   Assessment/Plan:   1. Vitamin D deficiency Low Vitamin D level contributes to fatigue and are associated with obesity, breast, and colon cancer. He agrees to increase his prescription Vitamin D @50 ,000 IU to twice weekly dosing and will follow-up for routine testing of Vitamin D, at least 2-3 times per year to avoid over-replacement. - Vitamin D, Ergocalciferol, (DRISDOL) 1.25 MG (50000 UNIT) CAPS capsule; TAKE 1 TAB BY MOUTH TWICE WEEKLY  Dispense:  10 capsule; Refill: 0  2. Prediabetes Robert Holland will continue to work on weight loss, exercise, and decreasing simple carbohydrates to help decrease the risk of diabetes.  - metFORMIN (GLUCOPHAGE) 500 MG tablet; Take 1 tablet (500 mg total) by mouth daily with breakfast.  Dispense: 30 tablet; Refill: 0  3. At risk for diabetes mellitus Robert Holland was given approximately 15 minutes of diabetes education and counseling today. We discussed intensive lifestyle modifications today with an emphasis on weight loss as well as increasing exercise and decreasing simple carbohydrates in his diet. We also reviewed medication options with an emphasis on risk versus benefit of those discussed.   Repetitive spaced learning was employed today to elicit superior memory formation and behavioral change.  4. Class 2 severe obesity with serious comorbidity and body mass index (BMI) of 37.0 to 37.9 in adult, unspecified obesity type (HCC) Robert Holland is currently in the action stage of change. As such, his goal is to continue with weight loss efforts. He has agreed to the Category 4 Plan.   Exercise goals: For substantial health benefits, adults should do at least 150 minutes (2 hours and 30 minutes) a week of moderate-intensity, or 75 minutes (1 hour and 15 minutes) a week of vigorous-intensity aerobic physical activity, or an equivalent combination of moderate- and vigorous-intensity aerobic activity. Aerobic activity should be performed in episodes of at least 10 minutes, and preferably, it should be spread throughout the week.  Behavioral modification strategies: avoiding temptations and planning for success.  Robert Holland has agreed to follow-up with our clinic in 4 weeks. He was informed of the importance of frequent follow-up visits to maximize his success with intensive lifestyle modifications for his multiple health conditions.   Objective:   Blood pressure 118/75, pulse 75, temperature 98.2 F (36.8 C), temperature source Oral,  height 6' (1.829 m), weight 274 lb (124.3 kg), SpO2 98 %. Body mass index is 37.16 kg/m.  General: Cooperative, alert, well developed, in no acute distress. HEENT: Conjunctivae and lids unremarkable. Cardiovascular: Regular rhythm.  Lungs: Normal work of breathing. Neurologic: No focal deficits.   Lab Results  Component Value Date   CREATININE 0.99 07/20/2019   BUN 14 07/20/2019   NA 140 07/20/2019   K 4.2 07/20/2019   CL 98 07/20/2019   CO2 26 07/20/2019   Lab Results  Component Value Date   ALT 70 (H) 07/20/2019   AST 44 (H) 07/20/2019   ALKPHOS 84 07/20/2019   BILITOT 0.9 07/20/2019   Lab Results  Component Value Date   HGBA1C 5.4 07/20/2019   HGBA1C 6.1 (H) 03/31/2019   HGBA1C 5.4 08/17/2017   Lab Results  Component Value Date   INSULIN 44.7 (H) 03/31/2019   Lab Results  Component Value Date   TSH 1.520 03/31/2019   Lab Results  Component Value Date   CHOL 198 07/20/2019   HDL 39 (L) 07/20/2019   LDLCALC 112 (H) 07/20/2019   TRIG 269 (H) 07/20/2019   CHOLHDL 5.1 (H) 07/20/2019   Lab Results  Component Value Date   WBC 7.3 07/20/2019   HGB 17.5 07/20/2019   HCT 51.5 (H) 07/20/2019   MCV 90 07/20/2019   PLT 224 07/20/2019   Attestation Statements:   Reviewed by clinician on day of visit: allergies, medications, problem list, medical history, surgical history, family history, social history, and previous encounter notes.  I, Water quality scientist, CMA, am acting as transcriptionist for Abby Potash, PA-C  I have reviewed the above documentation for accuracy and completeness, and I agree with the above. Abby Potash, PA-C

## 2019-09-07 ENCOUNTER — Ambulatory Visit (INDEPENDENT_AMBULATORY_CARE_PROVIDER_SITE_OTHER): Payer: BC Managed Care – PPO | Admitting: Physician Assistant

## 2019-09-07 ENCOUNTER — Other Ambulatory Visit: Payer: Self-pay

## 2019-09-07 VITALS — BP 120/84 | HR 78 | Temp 98.3°F | Ht 72.0 in | Wt 274.0 lb

## 2019-09-07 DIAGNOSIS — R7303 Prediabetes: Secondary | ICD-10-CM | POA: Diagnosis not present

## 2019-09-07 DIAGNOSIS — E559 Vitamin D deficiency, unspecified: Secondary | ICD-10-CM

## 2019-09-07 DIAGNOSIS — Z9189 Other specified personal risk factors, not elsewhere classified: Secondary | ICD-10-CM | POA: Diagnosis not present

## 2019-09-07 DIAGNOSIS — Z6837 Body mass index (BMI) 37.0-37.9, adult: Secondary | ICD-10-CM

## 2019-09-07 MED ORDER — METFORMIN HCL 500 MG PO TABS: 500.0000 mg | ORAL_TABLET | Freq: Every day | ORAL | 0 refills | Status: DC

## 2019-09-07 MED ORDER — VITAMIN D (ERGOCALCIFEROL) 1.25 MG (50000 UNIT) PO CAPS: ORAL_CAPSULE | ORAL | 0 refills | Status: DC

## 2019-09-07 NOTE — Progress Notes (Signed)
Chief Complaint:   OBESITY Robert Holland is here to discuss his progress with his obesity treatment plan along with follow-up of his obesity related diagnoses. Robert Holland is on the Category 4 Plan and journaling and states he is following his eating plan approximately 80% of the time. Robert Holland states he is walking 60 minutes weekly.   Today's visit was #: 9 Starting weight: 295 lbs Starting date: 03/31/2019 Today's weight: 274 lbs Today's date: 09/07/2019 Total lbs lost to date: 21 Total lbs lost since last in-office visit: 0  Interim History: Robert Holland states that he has been eating a lot of tomato sandwiches with mayonnaise and is not getting enough protein. He is also indulging in potato chips.  Subjective:   Vitamin D deficiency. Robert Holland is on prescription Vitamin D supplementation. No nausea, vomiting, or muscle weakness.    Ref. Range 07/20/2019 13:08  Vitamin D, 25-Hydroxy Latest Ref Range: 30.0 - 100.0 ng/mL 36.8   Prediabetes. Robert Holland has a diagnosis of prediabetes based on his elevated HgA1c and was informed this puts him at greater risk of developing diabetes. He continues to work on diet and exercise to decrease his risk of diabetes. He denies nausea or hypoglycemia.Robert Holland is on metformin. No nausea, vomiting, or diarrhea.   Lab Results  Component Value Date   HGBA1C 5.4 07/20/2019   Lab Results  Component Value Date   INSULIN 44.7 (H) 03/31/2019   At risk for heart disease. Robert Holland is at a higher than average risk for cardiovascular disease due to obesity.   Assessment/Plan:   Vitamin D deficiency. Low Vitamin D level contributes to fatigue and are associated with obesity, breast, and colon cancer. He was given a refill on his Vitamin D, Ergocalciferol, (DRISDOL) 1.25 MG (50000 UNIT) CAPS capsule twice weekly #10 with 0 refills and will follow-up for routine testing of Vitamin D, at least 2-3 times per year to avoid over-replacement.   Prediabetes. Robert Holland will continue to work on weight  loss, exercise, and decreasing simple carbohydrates to help decrease the risk of diabetes. Refill was given for metFORMIN (GLUCOPHAGE) 500 MG tablet #30 with 0 refills.  At risk for heart disease. Robert Holland was given approximately 15 minutes of coronary artery disease prevention counseling today. He is 54 y.o. male and has risk factors for heart disease including obesity. We discussed intensive lifestyle modifications today with an emphasis on specific weight loss instructions and strategies.   Repetitive spaced learning was employed today to elicit superior memory formation and behavioral change.  Class 2 severe obesity with serious comorbidity and body mass index (BMI) of 37.0 to 37.9 in adult, unspecified obesity type ([place]).  Robert Holland is currently in the action stage of change. As such, his goal is to continue with weight loss efforts. He has agreed to the Category 4 Plan.   Exercise goals: For substantial health benefits, adults should do at least 150 minutes (2 hours and 30 minutes) a week of moderate-intensity, or 75 minutes (1 hour and 15 minutes) a week of vigorous-intensity aerobic physical activity, or an equivalent combination of moderate- and vigorous-intensity aerobic activity. Aerobic activity should be performed in episodes of at least 10 minutes, and preferably, it should be spread throughout the week.  Behavioral modification strategies: increasing lean protein intake and keeping healthy foods in the home.  Robert Holland has agreed to follow-up with our clinic in 3 weeks. He was informed of the importance of frequent follow-up visits to maximize his success with intensive lifestyle modifications for his  multiple health conditions.   Objective:   Blood pressure 120/84, pulse 78, temperature 98.3 F (36.8 C), temperature source Oral, height 6' (1.829 m), weight 274 lb (124.3 kg), SpO2 98 %. Body mass index is 37.16 kg/m.  General: Cooperative, alert, well developed, in no acute distress. HEENT:  Conjunctivae and lids unremarkable. Cardiovascular: Regular rhythm.  Lungs: Normal work of breathing. Neurologic: No focal deficits.   Lab Results  Component Value Date   CREATININE 0.99 07/20/2019   BUN 14 07/20/2019   NA 140 07/20/2019   K 4.2 07/20/2019   CL 98 07/20/2019   CO2 26 07/20/2019   Lab Results  Component Value Date   ALT 70 (H) 07/20/2019   AST 44 (H) 07/20/2019   ALKPHOS 84 07/20/2019   BILITOT 0.9 07/20/2019   Lab Results  Component Value Date   HGBA1C 5.4 07/20/2019   HGBA1C 6.1 (H) 03/31/2019   HGBA1C 5.4 08/17/2017   Lab Results  Component Value Date   INSULIN 44.7 (H) 03/31/2019   Lab Results  Component Value Date   TSH 1.520 03/31/2019   Lab Results  Component Value Date   CHOL 198 07/20/2019   HDL 39 (L) 07/20/2019   LDLCALC 112 (H) 07/20/2019   TRIG 269 (H) 07/20/2019   CHOLHDL 5.1 (H) 07/20/2019   Lab Results  Component Value Date   WBC 7.3 07/20/2019   HGB 17.5 07/20/2019   HCT 51.5 (H) 07/20/2019   MCV 90 07/20/2019   PLT 224 07/20/2019   No results found for: IRON, TIBC, FERRITIN  Attestation Statements:   Reviewed by clinician on day of visit: allergies, medications, problem list, medical history, surgical history, family history, social history, and previous encounter notes.  IMichaelene Song, am acting as transcriptionist for Abby Potash, PA-C   I have reviewed the above documentation for accuracy and completeness, and I agree with the above. Abby Potash, PA-C

## 2019-09-28 ENCOUNTER — Ambulatory Visit (INDEPENDENT_AMBULATORY_CARE_PROVIDER_SITE_OTHER): Payer: BC Managed Care – PPO | Admitting: Physician Assistant

## 2019-09-28 ENCOUNTER — Encounter (INDEPENDENT_AMBULATORY_CARE_PROVIDER_SITE_OTHER): Payer: Self-pay | Admitting: Physician Assistant

## 2019-09-29 ENCOUNTER — Other Ambulatory Visit (INDEPENDENT_AMBULATORY_CARE_PROVIDER_SITE_OTHER): Payer: Self-pay | Admitting: *Deleted

## 2019-09-29 DIAGNOSIS — R7303 Prediabetes: Secondary | ICD-10-CM

## 2019-09-29 DIAGNOSIS — E559 Vitamin D deficiency, unspecified: Secondary | ICD-10-CM

## 2019-09-29 MED ORDER — VITAMIN D (ERGOCALCIFEROL) 1.25 MG (50000 UNIT) PO CAPS: ORAL_CAPSULE | ORAL | 0 refills | Status: DC

## 2019-09-29 MED ORDER — METFORMIN HCL 500 MG PO TABS: 500.0000 mg | ORAL_TABLET | Freq: Every day | ORAL | 0 refills | Status: DC

## 2019-09-30 ENCOUNTER — Other Ambulatory Visit (INDEPENDENT_AMBULATORY_CARE_PROVIDER_SITE_OTHER): Payer: Self-pay | Admitting: Physician Assistant

## 2019-09-30 DIAGNOSIS — R7303 Prediabetes: Secondary | ICD-10-CM

## 2019-10-19 ENCOUNTER — Ambulatory Visit (INDEPENDENT_AMBULATORY_CARE_PROVIDER_SITE_OTHER): Payer: BC Managed Care – PPO | Admitting: Physician Assistant

## 2019-10-25 ENCOUNTER — Other Ambulatory Visit (INDEPENDENT_AMBULATORY_CARE_PROVIDER_SITE_OTHER): Payer: Self-pay | Admitting: Physician Assistant

## 2019-10-25 DIAGNOSIS — R7303 Prediabetes: Secondary | ICD-10-CM

## 2019-12-12 ENCOUNTER — Encounter: Payer: Self-pay | Admitting: Family Medicine

## 2019-12-12 ENCOUNTER — Other Ambulatory Visit: Payer: Self-pay

## 2019-12-12 ENCOUNTER — Ambulatory Visit (INDEPENDENT_AMBULATORY_CARE_PROVIDER_SITE_OTHER): Payer: BC Managed Care – PPO | Admitting: Family Medicine

## 2019-12-12 VITALS — BP 122/68 | HR 81 | Temp 98.0°F | Ht 72.25 in | Wt 285.0 lb

## 2019-12-12 DIAGNOSIS — Z23 Encounter for immunization: Secondary | ICD-10-CM | POA: Diagnosis not present

## 2019-12-12 DIAGNOSIS — Z6838 Body mass index (BMI) 38.0-38.9, adult: Secondary | ICD-10-CM

## 2019-12-12 DIAGNOSIS — R7303 Prediabetes: Secondary | ICD-10-CM

## 2019-12-12 DIAGNOSIS — Z0000 Encounter for general adult medical examination without abnormal findings: Secondary | ICD-10-CM

## 2019-12-12 DIAGNOSIS — R7989 Other specified abnormal findings of blood chemistry: Secondary | ICD-10-CM

## 2019-12-12 DIAGNOSIS — Z125 Encounter for screening for malignant neoplasm of prostate: Secondary | ICD-10-CM | POA: Diagnosis not present

## 2019-12-12 DIAGNOSIS — E559 Vitamin D deficiency, unspecified: Secondary | ICD-10-CM | POA: Diagnosis not present

## 2019-12-12 NOTE — Progress Notes (Signed)
Subjective:    Patient ID: Robert Holland, male    DOB: 03-27-1965, 54 y.o.   MRN: [id number]  HPI Chief Complaint  Patient presents with  . Annual Exam   This is a 54 yo male who presents today for annual exam. Retiring in 1.5 years. He is accompanied by his wife.   Last CPE- 2019 PSA- will have today Colonoscopy- 09/01/2017 Tdap- 08/17/2017 Flu- annual Covid- fully vaccinated Dental- regular Eye- within last 2 years Exercise-not consistent Obesity- was going to [facility], had some weight loss, hard to maintain motivation, 2 meals a day, jerky, cheese sticks, water, occasional sweet tea on weekend.  Sleep- ok, 6-7 hours a night Stress- ok   Prediabetes- was on metformin, had decreased libido, stopped medication   Review of Systems  Constitutional: Negative.   HENT: Negative.   Eyes: Negative.   Respiratory: Negative.   Cardiovascular: Negative.   Gastrointestinal: Negative.   Endocrine: Negative.   Genitourinary: Negative.   Musculoskeletal: Negative.   Skin: Negative.   Allergic/Immunologic: Negative.   Neurological: Negative.   Hematological: Negative.   Psychiatric/Behavioral: Negative.        Objective:   Physical Exam Physical Exam  Constitutional: He is oriented to person, place, and time. He appears well-developed and well-nourished. Obese.  HENT:  Head: Normocephalic and atraumatic.  Right Ear: External ear normal. TM normal.  Left Ear: External ear normal.  TM normal.  Nose: Nose normal.  Mouth/Throat: Oropharynx is clear and moist.  Eyes: Conjunctivae are normal.  Neck: Normal range of motion. Neck supple.  Cardiovascular: Normal rate, regular rhythm, normal heart sounds and intact distal pulses.   Pulmonary/Chest: Effort normal and breath sounds normal.  Abdominal: Soft. Bowel sounds are normal.  Musculoskeletal: Normal range of motion. He exhibits no edema or tenderness.       Cervical back: Normal.       Thoracic back: Normal.       Lumbar back:  Normal.  Lymphadenopathy:    He has no cervical adenopathy.       Right: No inguinal adenopathy present.       Left: No inguinal adenopathy present.  Neurological: He is alert and oriented to person, place, and time.  Skin: Skin is warm and dry.  Psychiatric: He has a normal mood and affect. His behavior is normal. Judgment normal.  Vitals reviewed.     BP 122/68   Pulse 81   Temp 98 F (36.7 C) (Temporal)   Ht 6' 0.25" (1.835 m)   Wt 285 lb (129.3 kg)   SpO2 96%   BMI 38.39 kg/m  Wt Readings from Last 3 Encounters:  12/12/19 285 lb (129.3 kg)  09/07/19 274 lb (124.3 kg)  08/10/19 274 lb (124.3 kg)       Assessment & Plan:  1. Annual physical exam - Discussed and encouraged healthy lifestyle choices- adequate sleep, regular exercise, stress management and healthy food choices.    2. Elevated LFTs - Comprehensive metabolic panel - Hepatitis C antibody - Hepatitis B surface antigen - Hepatitis B surface antibody,quantitative - Hepatitis B core antibody, IgM - US ABDOMEN LIMITED RUQ (LIVER/GB); Future  3. Screening for prostate cancer - PSA  4. Vitamin D deficiency - Vitamin D, 25-hydroxy  5. Prediabetes - Hemoglobin A1c  6. Need for influenza vaccination - Flu Vaccine QUAD 6+ mos PF IM (Fluarix Quad PF)  7. Class 2 severe obesity due to excess calories with serious comorbidity and body mass index (BMI) of 38.0  to 38.9 in adult [facility]) - discussed weight loss, encouraged him to walk outside most days, increase protein, non starchy vegetables, decrease sugars/ starches, eating window  - follow up in 6 months  This visit occurred during the SARS-CoV-2 public health emergency.  Safety protocols were in place, including screening questions prior to the visit, additional usage of staff PPE, and extensive cleaning of exam room while observing appropriate contact time as indicated for disinfecting solutions.      Clarene Reamer, FNP-BC  [facility] at  [facility]  12/13/2019 1:38 PM

## 2019-12-12 NOTE — Patient Instructions (Addendum)
Good to see you today  I iwll notify you of labs  Please follow up in 6 months  There is not one right eating plan for everyone.  It may take trial and error to find what will work for you.  It is important to get adequate protein and fiber with your meals.  It is okay to not eat breakfast or to skip meals if you are not hungry.  Avoid snacking between meals.  Unless you are on a fluid restriction, drink 80 to 90 ounces of water a day.  Suggested resources- www.dietdoctor.com/diabetes/diet www.adaptyourlifeacademy.com-there is a quiz to help you determine how many carbohydrates you should eat a day  www.thefastingmethod.com  Here are some guidelines to help you with meal planning -  Avoid all processed and packaged foods (bread, pasta, crackers, chips, etc) and beverages containing calories.  Avoid added sugars and excessive natural sugars.  Pay attention to how you feel if you consume artificial sweeteners.  Do they make you more hungry or raise your blood sugar?  With every meal and snack, aim to get 20 g of protein (3 ounces of meat, 4 ounces of fish, 3 eggs, protein powder, 1 cup [place] yogurt, 1 cup cottage cheese, etc.)  Increase fiber in the form of non-starchy vegetables.  These help you feel full with very little carbohydrates and are good for gut health.  Nonstarchy vegetables include summer squash, onions, peppers, tomatoes, eggplant, broccoli, cauliflower, cabbage, lettuce, spinach.  Have small amounts of good fats such as avocado, nuts, olive oil, nut butters, olives.  Add a little cheese to your meals to make them tasty.   Try to plan your meals for the week and do some meal preparation when able.  If possible, make lunches for the week ahead of time.  Plan a couple of dinners and make enough so you can have leftovers.  Build in a treat once a week.

## 2019-12-13 LAB — COMPREHENSIVE METABOLIC PANEL
ALT: 75 U/L — ABNORMAL HIGH (ref 0–53)
AST: 55 U/L — ABNORMAL HIGH (ref 0–37)
Albumin: 4.5 g/dL (ref 3.5–5.2)
Alkaline Phosphatase: 71 U/L (ref 39–117)
BUN: 13 mg/dL (ref 6–23)
CO2: 31 mEq/L (ref 19–32)
Calcium: 9.7 mg/dL (ref 8.4–10.5)
Chloride: 99 mEq/L (ref 96–112)
Creatinine, Ser: 0.97 mg/dL (ref 0.40–1.50)
GFR: 88.17 mL/min (ref >60.00)
Glucose, Bld: 86 mg/dL (ref 70–99)
Potassium: 4.7 mEq/L (ref 3.5–5.1)
Sodium: 137 mEq/L (ref 135–145)
Total Bilirubin: 0.8 mg/dL (ref 0.2–1.2)
Total Protein: 7 g/dL (ref 6.0–8.3)

## 2019-12-13 LAB — HEPATITIS B CORE ANTIBODY, IGM: Hep B C IgM: NONREACTIVE

## 2019-12-13 LAB — HEPATITIS B SURFACE ANTIBODY, QUANTITATIVE: Hep B S AB Quant (Post): <5 m[IU]/mL — ABNORMAL LOW (ref >=10)

## 2019-12-13 LAB — HEPATITIS C ANTIBODY
Hepatitis C Ab: NONREACTIVE
SIGNAL TO CUT-OFF: 0.01 (ref <1.00)

## 2019-12-13 LAB — HEPATITIS B SURFACE ANTIGEN: Hepatitis B Surface Ag: NONREACTIVE

## 2019-12-13 LAB — HEMOGLOBIN A1C: Hgb A1c MFr Bld: 5.9 % (ref 4.6–6.5)

## 2019-12-13 LAB — PSA: PSA: 0.5 ng/mL (ref 0.10–4.00)

## 2019-12-13 LAB — VITAMIN D 25 HYDROXY (VIT D DEFICIENCY, FRACTURES): VITD: 38.68 ng/mL (ref 30.00–100.00)

## 2019-12-15 ENCOUNTER — Encounter: Payer: Self-pay | Admitting: Family Medicine

## 2019-12-16 ENCOUNTER — Other Ambulatory Visit: Payer: Self-pay | Admitting: Nurse Practitioner

## 2019-12-16 ENCOUNTER — Telehealth (HOSPITAL_COMMUNITY): Payer: Self-pay

## 2019-12-16 DIAGNOSIS — U071 COVID-19: Secondary | ICD-10-CM

## 2019-12-16 NOTE — Progress Notes (Signed)
I connected by phone with Robert Holland on 12/16/2019 at 4:47 PM to discuss the potential use of an new treatment for mild to moderate COVID-19 viral infection in non-hospitalized patients.  This patient is a 54 y.o. male that meets the FDA criteria for Emergency Use Authorization of bamlanivimab/etesevimab or casirivimab\imdevimab.  Has a (+) direct SARS-CoV-2 viral test result  Has mild or moderate COVID-19   Is ? 55 years of age and weighs ? 40 kg  Is NOT hospitalized due to COVID-19  Is NOT requiring oxygen therapy or requiring an increase in baseline oxygen flow rate due to COVID-19  Is within 10 days of symptom onset  Has at least one of the high risk factor(s) for progression to severe COVID-19 and/or hospitalization as defined in EUA.  Specific high risk criteria : BMI > 25   I have spoken and communicated the following to the patient or parent/caregiver:  1. FDA has authorized the emergency use of bamlanivimab/etesevimab and casirivimab\imdevimab for the treatment of mild to moderate COVID-19 in adults and pediatric patients with positive results of direct SARS-CoV-2 viral testing who are 66 years of age and older weighing at least 40 kg, and who are at high risk for progressing to severe COVID-19 and/or hospitalization.  2. The significant known and potential risks and benefits of bamlanivimab/etesevimab and casirivimab\imdevimab, and the extent to which such potential risks and benefits are unknown.  3. Information on available alternative treatments and the risks and benefits of those alternatives, including clinical trials.  4. Patients treated with bamlanivimab/etesevimab and casirivimab\imdevimab should continue to self-isolate and use infection control measures (e.g., wear mask, isolate, social distance, avoid sharing personal items, clean and disinfect "high touch" surfaces, and frequent handwashing) according to CDC guidelines.   5. The patient or parent/caregiver has  the option to accept or refuse bamlanivimab/etesevimab or casirivimab\imdevimab .  After reviewing this information with the patient, the patient has agreed to receive one of the available covid 19 monoclonal antibodies and will be provided an appropriate fact sheet prior to infusion.Beckey Rutter, [place], AGNP-C [phone] ([place])

## 2019-12-16 NOTE — Telephone Encounter (Signed)
Called to Discuss with patient about Covid symptoms and the use of the monoclonal antibody infusion for those with mild to moderate Covid symptoms and at a high risk of hospitalization.     Pt appears to qualify for this infusion due to co-morbid conditions and/or a member of an at-risk group in accordance with the FDA Emergency Use Authorization.  Risk factor: BMI, obesity  Pt with sx onset 12/09/19. Positive test at [facility] 12/15/19.  Pt pre-screened by RN and ready for APP to call and further discuss/set up appt for monoclonal antibody infusion.

## 2019-12-17 ENCOUNTER — Ambulatory Visit (HOSPITAL_COMMUNITY)
Admission: RE | Admit: 2019-12-17 | Discharge: 2019-12-17 | Disposition: A | Discharge status: Home / Self Care | Payer: BC Managed Care – PPO | Source: Ambulatory Visit | Attending: Pulmonary Disease | Admitting: Pulmonary Disease

## 2019-12-17 ENCOUNTER — Other Ambulatory Visit (HOSPITAL_COMMUNITY): Payer: Self-pay

## 2019-12-17 DIAGNOSIS — U071 COVID-19: Secondary | ICD-10-CM | POA: Insufficient documentation

## 2019-12-17 MED ORDER — EPINEPHRINE 0.3 MG/0.3ML IJ SOAJ: 0.3000 mg | Freq: Once | INTRAMUSCULAR | Status: DC | PRN

## 2019-12-17 MED ORDER — SODIUM CHLORIDE 0.9 % IV SOLN: Freq: Once | INTRAVENOUS | Status: AC

## 2019-12-17 MED ORDER — DIPHENHYDRAMINE HCL 50 MG/ML IJ SOLN: 50.0000 mg | Freq: Once | INTRAMUSCULAR | Status: DC | PRN

## 2019-12-17 MED ORDER — SODIUM CHLORIDE 0.9 % IV SOLN: INTRAVENOUS | Status: DC | PRN

## 2019-12-17 MED ORDER — ALBUTEROL SULFATE HFA 108 (90 BASE) MCG/ACT IN AERS: 2.0000 | INHALATION_SPRAY | Freq: Once | RESPIRATORY_TRACT | Status: DC | PRN

## 2019-12-17 MED ORDER — FAMOTIDINE IN NACL 20-0.9 MG/50ML-% IV SOLN: 20.0000 mg | Freq: Once | INTRAVENOUS | Status: DC | PRN

## 2019-12-17 MED ORDER — METHYLPREDNISOLONE SODIUM SUCC 125 MG IJ SOLR: 125.0000 mg | Freq: Once | INTRAMUSCULAR | Status: DC | PRN

## 2019-12-17 NOTE — Progress Notes (Signed)
  Diagnosis: COVID-19  Physician: Dr. Asencion Noble  Procedure: Covid Infusion Clinic Med: bamlanivimab\etesevimab infusion - Provided patient with bamlanimivab\etesevimab fact sheet for patients, parents and caregivers prior to infusion.  Complications: No immediate complications noted.  Discharge: Discharged home   Janine Ores 12/17/2019

## 2019-12-17 NOTE — Discharge Instructions (Signed)

## 2019-12-21 ENCOUNTER — Other Ambulatory Visit: Payer: BC Managed Care – PPO

## 2019-12-26 ENCOUNTER — Encounter: Payer: BC Managed Care – PPO | Admitting: Family Medicine

## 2019-12-26 ENCOUNTER — Telehealth: Payer: Self-pay | Admitting: Family Medicine

## 2019-12-26 NOTE — Telephone Encounter (Signed)
Spouse called to schedule  Hep b vaccine for pt  Ok to schedule??

## 2019-12-26 NOTE — Telephone Encounter (Signed)
Called and spoke to pt's wife, shedule nv for hepatitis b vaccine

## 2020-01-05 ENCOUNTER — Ambulatory Visit
Admission: RE | Admit: 2020-01-05 | Discharge: 2020-01-05 | Disposition: A | Discharge status: Home / Self Care | Payer: BC Managed Care – PPO | Source: Ambulatory Visit | Attending: Family Medicine | Admitting: Family Medicine

## 2020-01-05 DIAGNOSIS — R7989 Other specified abnormal findings of blood chemistry: Secondary | ICD-10-CM

## 2020-01-10 ENCOUNTER — Ambulatory Visit (INDEPENDENT_AMBULATORY_CARE_PROVIDER_SITE_OTHER): Payer: BC Managed Care – PPO | Admitting: *Deleted

## 2020-01-10 ENCOUNTER — Other Ambulatory Visit: Payer: Self-pay

## 2020-01-10 DIAGNOSIS — Z23 Encounter for immunization: Secondary | ICD-10-CM

## 2020-01-10 NOTE — Progress Notes (Signed)
Per orders of Tor Netters, NP, injection of Hepatitis B given by Lauralyn Primes. Patient tolerated injection well.

## 2020-02-08 ENCOUNTER — Ambulatory Visit: Payer: BC Managed Care – PPO

## 2020-02-08 ENCOUNTER — Telehealth: Payer: Self-pay

## 2020-02-08 ENCOUNTER — Other Ambulatory Visit: Payer: Self-pay

## 2020-02-08 ENCOUNTER — Telehealth: Payer: Self-pay | Admitting: Family Medicine

## 2020-02-08 NOTE — Telephone Encounter (Signed)
Patient called stating that he was told he couldn't come in to get his hep shot. EM

## 2020-02-08 NOTE — Telephone Encounter (Signed)
Pt was going to be too early for his hep B vaccine. It had to be one month and 1 day.  Pt has been rescheduled. Pt appreciative and will be back for apt next week.

## 2020-02-08 NOTE — Telephone Encounter (Signed)
Called and left detailed voicemail (ok per DPR) stating that since the patient had his first Hep B shot on 01/10/2020. Informed patient that the 2nd dose has to be 1-2 months after his first dose, so we would have to re-schedule his nurse visit appointment for today. Instructed patient to call back to schedule another nurse visit appointment after tomorrow.

## 2020-02-09 ENCOUNTER — Ambulatory Visit: Payer: BC Managed Care – PPO

## 2020-02-16 ENCOUNTER — Ambulatory Visit (INDEPENDENT_AMBULATORY_CARE_PROVIDER_SITE_OTHER): Payer: BC Managed Care – PPO

## 2020-02-16 DIAGNOSIS — Z23 Encounter for immunization: Secondary | ICD-10-CM

## 2020-02-16 NOTE — Progress Notes (Signed)
Per orders of Dr. Gweneth Dimitri , injection of second Hep B  given by Donnamarie Poag in left deltoid. Patient tolerated injection well. Patient will make appointment for 6 months from first injections at check out. Vis given and [facility] updated.

## 2020-03-15 ENCOUNTER — Telehealth: Payer: Self-pay | Admitting: Family Medicine

## 2020-03-15 NOTE — Telephone Encounter (Signed)
Called and left vm for the patient to call and schedule TOC appt per DPR. EM °

## 2020-05-16 ENCOUNTER — Encounter: Payer: BC Managed Care – PPO | Admitting: Family Medicine

## 2020-05-24 ENCOUNTER — Other Ambulatory Visit: Payer: Self-pay

## 2020-05-24 ENCOUNTER — Encounter: Payer: Self-pay | Admitting: Family Medicine

## 2020-05-24 ENCOUNTER — Ambulatory Visit (INDEPENDENT_AMBULATORY_CARE_PROVIDER_SITE_OTHER): Payer: Self-pay | Admitting: Family Medicine

## 2020-05-24 VITALS — BP 122/90 | HR 97 | Temp 98.1°F | Ht 72.0 in | Wt 292.5 lb

## 2020-05-24 DIAGNOSIS — E785 Hyperlipidemia, unspecified: Secondary | ICD-10-CM | POA: Insufficient documentation

## 2020-05-24 DIAGNOSIS — Z1211 Encounter for screening for malignant neoplasm of colon: Secondary | ICD-10-CM

## 2020-05-24 DIAGNOSIS — R7303 Prediabetes: Secondary | ICD-10-CM

## 2020-05-24 DIAGNOSIS — K635 Polyp of colon: Secondary | ICD-10-CM

## 2020-05-24 DIAGNOSIS — Z720 Tobacco use: Secondary | ICD-10-CM

## 2020-05-24 DIAGNOSIS — E669 Obesity, unspecified: Secondary | ICD-10-CM

## 2020-05-24 MED ORDER — METFORMIN HCL 500 MG PO TABS: 500.0000 mg | ORAL_TABLET | Freq: Every day | ORAL | 3 refills | Status: DC

## 2020-05-24 NOTE — Assessment & Plan Note (Signed)
Start metformin 500 mg daily. Encouraged healthy diet and exercise.

## 2020-05-24 NOTE — Assessment & Plan Note (Signed)
Discussed increased risk for oral cancer. Encouraged regular dental visits. And risk for heart disease. Not ready to quit at this time.

## 2020-05-24 NOTE — Progress Notes (Signed)
Subjective:     Robert Holland is a 55 y.o. male presenting for Transitions Of Care and Referral (Colonoscopy- with Dr. Sherri Sear)     HPI  #Obesity - wants to lose weight - has a lot of things going on - building a house and planning to move in - waiting for power, stress - at this point - job is active - does some activity - hoping to work more on diet when things settle down  Review of Systems   Social History   Tobacco Use  Smoking Status Never Smoker  Smokeless Tobacco Current User  . Types: Snuff        Objective:    BP Readings from Last 3 Encounters:  05/24/20 122/90  12/17/19 128/80  12/12/19 122/68   Wt Readings from Last 3 Encounters:  05/24/20 292 lb 8 oz (132.7 kg)  12/12/19 285 lb (129.3 kg)  09/07/19 274 lb (124.3 kg)    BP 122/90   Pulse 97   Temp 98.1 F (36.7 C) (Temporal)   Ht 6' (1.829 m)   Wt 292 lb 8 oz (132.7 kg)   SpO2 97%   BMI 39.67 kg/m    Physical Exam Constitutional:      Appearance: Normal appearance. He is obese. He is not ill-appearing or diaphoretic.  HENT:     Right Ear: External ear normal.     Left Ear: External ear normal.  Eyes:     General: No scleral icterus.    Extraocular Movements: Extraocular movements intact.     Conjunctiva/sclera: Conjunctivae normal.  Cardiovascular:     Rate and Rhythm: Normal rate and regular rhythm.     Heart sounds: No murmur heard.   Pulmonary:     Effort: Pulmonary effort is normal. No respiratory distress.     Breath sounds: Normal breath sounds. No wheezing.  Musculoskeletal:     Cervical back: Neck supple.  Skin:    General: Skin is warm and dry.  Neurological:     Mental Status: He is alert. Mental status is at baseline.  Psychiatric:        Mood and Affect: Mood normal.          Assessment & Plan:   Problem List Items Addressed This Visit      Other   Obesity (BMI 35.0-39.9 without comorbidity)    Start metformin 500 mg daily. Encouraged healthy  diet and exercise.       Relevant Medications   metFORMIN (GLUCOPHAGE) 500 MG tablet   Prediabetes    Discussed benefit of metformin for weight loss and for maintaining prediabetes range longer. He will start once daily metformin. Working on healthy diet/exercise. Return 6 months.       Relevant Medications   metFORMIN (GLUCOPHAGE) 500 MG tablet   Hyperlipidemia    ASCVD 6.3%. Below threshold for statin. Work on diet/exercise.       Chews tobacco    Discussed increased risk for oral cancer. Encouraged regular dental visits. And risk for heart disease. Not ready to quit at this time.        Other Visit Diagnoses    Polyp of colon, unspecified part of colon, unspecified type    -  Primary   Relevant Orders   Ambulatory referral to Gastroenterology   Screening for colon cancer       Relevant Orders   Ambulatory referral to Gastroenterology       Return in about 6 months (around 11/23/2020) for  annual and check in.  Lesleigh Noe, MD  This visit occurred during the SARS-CoV-2 public health emergency.  Safety protocols were in place, including screening questions prior to the visit, additional usage of staff PPE, and extensive cleaning of exam room while observing appropriate contact time as indicated for disinfecting solutions.

## 2020-05-24 NOTE — Patient Instructions (Signed)
Your screen for diabetes shows that you have prediabetes. This means that you are at risk for developing diabetes.   You can slow the progression to diabetes through healthy diet and exercise.   Eat healthy foods Get at least 150 minutes of moderate aerobic physical activity a week, or about 30 minutes on most days of the week Lose excess weight Control your blood pressure and cholesterol Don't smoke  Start metformin once daily with meal

## 2020-05-24 NOTE — Assessment & Plan Note (Signed)
Discussed benefit of metformin for weight loss and for maintaining prediabetes range longer. He will start once daily metformin. Working on healthy diet/exercise. Return 6 months.

## 2020-05-24 NOTE — Assessment & Plan Note (Signed)
ASCVD 6.3%. Below threshold for statin. Work on diet/exercise.

## 2020-06-14 ENCOUNTER — Encounter: Payer: Self-pay | Admitting: *Deleted

## 2020-06-25 ENCOUNTER — Encounter: Payer: Self-pay | Admitting: Family Medicine

## 2020-06-25 ENCOUNTER — Other Ambulatory Visit: Payer: Self-pay

## 2020-06-25 ENCOUNTER — Ambulatory Visit: Payer: BC Managed Care – PPO | Admitting: Family Medicine

## 2020-06-25 VITALS — BP 122/74 | HR 87 | Temp 97.7°F | Ht 72.0 in | Wt 278.0 lb

## 2020-06-25 DIAGNOSIS — E1169 Type 2 diabetes mellitus with other specified complication: Secondary | ICD-10-CM | POA: Insufficient documentation

## 2020-06-25 DIAGNOSIS — R7303 Prediabetes: Secondary | ICD-10-CM | POA: Diagnosis not present

## 2020-06-25 DIAGNOSIS — R634 Abnormal weight loss: Secondary | ICD-10-CM | POA: Insufficient documentation

## 2020-06-25 DIAGNOSIS — E119 Type 2 diabetes mellitus without complications: Secondary | ICD-10-CM | POA: Diagnosis not present

## 2020-06-25 DIAGNOSIS — E785 Hyperlipidemia, unspecified: Secondary | ICD-10-CM | POA: Diagnosis not present

## 2020-06-25 LAB — CBC WITH DIFFERENTIAL/PLATELET
Basophils Absolute: 0 10*3/uL (ref 0.0–0.1)
Basophils Relative: 0.6 % (ref 0.0–3.0)
Eosinophils Absolute: 0.2 10*3/uL (ref 0.0–0.7)
Eosinophils Relative: 3.7 % (ref 0.0–5.0)
HCT: 49.4 % (ref 39.0–52.0)
Hemoglobin: 17.3 g/dL — ABNORMAL HIGH (ref 13.0–17.0)
Lymphocytes Relative: 25.2 % (ref 12.0–46.0)
Lymphs Abs: 1.7 10*3/uL (ref 0.7–4.0)
MCHC: 35.1 g/dL (ref 30.0–36.0)
MCV: 88.5 fl (ref 78.0–100.0)
Monocytes Absolute: 0.5 10*3/uL (ref 0.1–1.0)
Monocytes Relative: 7.4 % (ref 3.0–12.0)
Neutro Abs: 4.2 10*3/uL (ref 1.4–7.7)
Neutrophils Relative %: 63.1 % (ref 43.0–77.0)
Platelets: 182 10*3/uL (ref 150.0–400.0)
RBC: 5.59 Mil/uL (ref 4.22–5.81)
RDW: 12.5 % (ref 11.5–15.5)
WBC: 6.7 10*3/uL (ref 4.0–10.5)

## 2020-06-25 LAB — POCT GLYCOSYLATED HEMOGLOBIN (HGB A1C): Hemoglobin A1C: 10.7 % — AB (ref 4.0–5.6)

## 2020-06-25 LAB — COMPREHENSIVE METABOLIC PANEL
ALT: 41 U/L (ref 0–53)
AST: 24 U/L (ref 0–37)
Albumin: 4.5 g/dL (ref 3.5–5.2)
Alkaline Phosphatase: 103 U/L (ref 39–117)
BUN: 19 mg/dL (ref 6–23)
CO2: 30 mEq/L (ref 19–32)
Calcium: 10 mg/dL (ref 8.4–10.5)
Chloride: 95 mEq/L — ABNORMAL LOW (ref 96–112)
Creatinine, Ser: 1.11 mg/dL (ref 0.40–1.50)
GFR: 74.72 mL/min (ref >60.00)
Glucose, Bld: 380 mg/dL — ABNORMAL HIGH (ref 70–99)
Potassium: 5.5 mEq/L — ABNORMAL HIGH (ref 3.5–5.1)
Sodium: 133 mEq/L — ABNORMAL LOW (ref 135–145)
Total Bilirubin: 1.3 mg/dL — ABNORMAL HIGH (ref 0.2–1.2)
Total Protein: 7.2 g/dL (ref 6.0–8.3)

## 2020-06-25 LAB — LIPID PANEL
Cholesterol: 170 mg/dL (ref 0–200)
HDL: 30.4 mg/dL — ABNORMAL LOW (ref >39.00)
Total CHOL/HDL Ratio: 6
Triglycerides: 711 mg/dL — ABNORMAL HIGH (ref 0.0–149.0)

## 2020-06-25 LAB — MICROALBUMIN / CREATININE URINE RATIO
Creatinine,U: 135.2 mg/dL
Microalb Creat Ratio: 1.8 mg/g (ref 0.0–30.0)
Microalb, Ur: 2.4 mg/dL — ABNORMAL HIGH (ref 0.0–1.9)

## 2020-06-25 LAB — LDL CHOLESTEROL, DIRECT: Direct LDL: 52 mg/dL

## 2020-06-25 LAB — TSH: TSH: 1.31 u[IU]/mL (ref 0.35–4.50)

## 2020-06-25 MED ORDER — METFORMIN HCL 500 MG PO TABS: 1000.0000 mg | ORAL_TABLET | Freq: Two times a day (BID) | ORAL | 1 refills | Status: DC

## 2020-06-25 NOTE — Progress Notes (Signed)
Subjective:     Robert Holland is a 55 y.o. male presenting for Nausea     HPI   #Nausea - Started 2 weeks ago - tired in the afternoon - constant mild nausea - increased thirst - urinating throughout the night - mouth is dry - getting up 3 times overnight to urinate - has prediabetes and is on metformin - Has lost 15 lbs - continues to eat normally - no diet recently - no night-sweats - no fevers/chills  - no association with the diarrhea/constipation  Drinking 1 gallon of water daily and drinks up until bedtime  Does not take any supplements Did stop metformin when all this was happening  Denies any heartburn or reflux  Does snore at night Waking up around 3 am and has difficulty falling back at sleep Sleep at 9 - up at 11, 12:30 Normal wake up time would be 5 am  Review of Systems  Constitutional: Positive for fatigue. Negative for chills and fever.  Respiratory: Negative for cough and shortness of breath.   Cardiovascular: Negative for chest pain and palpitations.  Gastrointestinal: Positive for constipation, diarrhea and nausea. Negative for abdominal pain, blood in stool and vomiting.  Endocrine: Positive for polydipsia and polyuria. Negative for cold intolerance and heat intolerance.  Genitourinary: Positive for frequency.  Neurological: Positive for headaches.     Social History   Tobacco Use  Smoking Status Never Smoker  Smokeless Tobacco Current User  . Types: Snuff        Objective:    BP Readings from Last 3 Encounters:  06/25/20 122/74  05/24/20 122/90  12/17/19 128/80   Wt Readings from Last 3 Encounters:  06/25/20 278 lb (126.1 kg)  05/24/20 292 lb 8 oz (132.7 kg)  12/12/19 285 lb (129.3 kg)    BP 122/74   Pulse 87   Temp 97.7 F (36.5 C) (Temporal)   Ht 6' (1.829 m)   Wt 278 lb (126.1 kg)   SpO2 97%   BMI 37.70 kg/m    Physical Exam Constitutional:      Appearance: Normal appearance. He is obese. He is not  ill-appearing or diaphoretic.  HENT:     Right Ear: External ear normal.     Left Ear: External ear normal.     Nose: Nose normal.  Eyes:     General: No scleral icterus.    Extraocular Movements: Extraocular movements intact.     Conjunctiva/sclera: Conjunctivae normal.  Cardiovascular:     Rate and Rhythm: Normal rate and regular rhythm.     Heart sounds: No murmur heard.   Pulmonary:     Effort: Pulmonary effort is normal. No respiratory distress.     Breath sounds: Normal breath sounds. No wheezing.  Abdominal:     General: Abdomen is flat. Bowel sounds are normal. There is no distension.     Palpations: Abdomen is soft.     Tenderness: There is abdominal tenderness (epigastric). There is no guarding or rebound.  Musculoskeletal:     Cervical back: Neck supple.  Lymphadenopathy:     Cervical: No cervical adenopathy.  Skin:    General: Skin is warm and dry.  Neurological:     Mental Status: He is alert. Mental status is at baseline.  Psychiatric:        Mood and Affect: Mood normal.           Assessment & Plan:   Problem List Items Addressed This Visit  Endocrine   Controlled type 2 diabetes mellitus without complication, without long-term current use of insulin ([place]) - Primary    C/b obesity and hld. Lab Results  Component Value Date   HGBA1C 10.7 (A) 06/25/2020  Suspect nausea, polydipsia, and polyuria and weight loss all 2/2 to worsening diabetes. Suspect type 2 given prediabetes. Restart metformin with titration to 1000 mg bid. Nutrition referral and work on diabetic diet. Return 1 month for check in.        Relevant Medications   metFORMIN (GLUCOPHAGE) 500 MG tablet   Other Relevant Orders   POCT HgB A1C (Completed)   Microalbumin / creatinine urine ratio     Other   Prediabetes   Relevant Medications   metFORMIN (GLUCOPHAGE) 500 MG tablet   Hyperlipidemia   Relevant Orders   Lipid panel   Unexplained weight loss    Etiology most likely  Diabetes, but given 15 lbs with normal eating will check liver, kidney, and blood counts to evaluate for other possible causes. Reassess in 1 month. Due for colonoscopy in July.       Relevant Orders   Comprehensive metabolic panel   TSH   CBC with Differential    Other Visit Diagnoses    Type 2 diabetes mellitus with other specified complication, without long-term current use of insulin ([place])       Relevant Medications   metFORMIN (GLUCOPHAGE) 500 MG tablet   Other Relevant Orders   Ambulatory referral to diabetic education       Return in about 1 month (around 07/26/2020) for diabetes follow-up.  Lesleigh Noe, MD  This visit occurred during the SARS-CoV-2 public health emergency.  Safety protocols were in place, including screening questions prior to the visit, additional usage of staff PPE, and extensive cleaning of exam room while observing appropriate contact time as indicated for disinfecting solutions.

## 2020-06-25 NOTE — Assessment & Plan Note (Signed)
Etiology most likely Diabetes, but given 15 lbs with normal eating will check liver, kidney, and blood counts to evaluate for other possible causes. Reassess in 1 month. Due for colonoscopy in July.

## 2020-06-25 NOTE — Assessment & Plan Note (Signed)
C/b obesity and hld. Lab Results  Component Value Date   HGBA1C 10.7 (A) 06/25/2020  Suspect nausea, polydipsia, and polyuria and weight loss all 2/2 to worsening diabetes. Suspect type 2 given prediabetes. Restart metformin with titration to 1000 mg bid. Nutrition referral and work on diabetic diet. Return 1 month for check in.

## 2020-06-25 NOTE — Patient Instructions (Signed)
Your hemoglobin A1c was elevated and shows that you have diabetes.   Diabetes is treated with diet and medication- avoid sugar (especially sugary beverages like soda and sweet tea), carbohydrates - like baked goods and bread. Try to eat lean protein (fish and chicken) and lots of green veggies. You can go to Diabetes.org for more information on dietary changes and call the clinic and I can place a referral to see a diabetes nutritionist.   I would like you to start a medication call Metformin.   I am prescribing a 500 mg tablet. The most common side effect is stomach upset (nausea and diarrhea). Below is a 4 week plan to increase. If you are experiencing side effects, do not move on to the next week unless your symptoms are better.   Week 1: Take 500 mg in the morning with food Week 2: Take 500 mg in the morning and the evening with food Week 3: Take 1000 mg (2 tablets) in the morning and 500 mg in the evening with food Week 4: Take 1000 mg (2 tablets) in the morning and evening with food

## 2020-07-12 ENCOUNTER — Ambulatory Visit: Payer: BC Managed Care – PPO

## 2020-07-18 ENCOUNTER — Ambulatory Visit (INDEPENDENT_AMBULATORY_CARE_PROVIDER_SITE_OTHER): Payer: BC Managed Care – PPO

## 2020-07-18 ENCOUNTER — Other Ambulatory Visit: Payer: Self-pay

## 2020-07-18 DIAGNOSIS — Z23 Encounter for immunization: Secondary | ICD-10-CM | POA: Diagnosis not present

## 2020-07-18 NOTE — Progress Notes (Signed)
Per orders of Dr. Silvio Pate, in Dr. Verda Cumins absence, 3rd injection of Hepatitis B,  given by Loreen Freud. Patient tolerated injection well.

## 2020-08-06 ENCOUNTER — Encounter: Payer: BC Managed Care – PPO | Attending: Family Medicine | Admitting: *Deleted

## 2020-08-06 ENCOUNTER — Ambulatory Visit: Payer: BC Managed Care – PPO | Admitting: Family Medicine

## 2020-08-06 ENCOUNTER — Encounter: Payer: Self-pay | Admitting: *Deleted

## 2020-08-06 ENCOUNTER — Other Ambulatory Visit: Payer: Self-pay

## 2020-08-06 VITALS — BP 100/64 | HR 100 | Temp 97.9°F | Ht 72.0 in | Wt 275.8 lb

## 2020-08-06 VITALS — BP 118/76 | Ht 72.0 in | Wt 275.3 lb

## 2020-08-06 DIAGNOSIS — E1169 Type 2 diabetes mellitus with other specified complication: Secondary | ICD-10-CM | POA: Diagnosis not present

## 2020-08-06 DIAGNOSIS — Z23 Encounter for immunization: Secondary | ICD-10-CM

## 2020-08-06 DIAGNOSIS — E785 Hyperlipidemia, unspecified: Secondary | ICD-10-CM

## 2020-08-06 DIAGNOSIS — R634 Abnormal weight loss: Secondary | ICD-10-CM | POA: Diagnosis not present

## 2020-08-06 DIAGNOSIS — E119 Type 2 diabetes mellitus without complications: Secondary | ICD-10-CM | POA: Insufficient documentation

## 2020-08-06 MED ORDER — ATORVASTATIN CALCIUM 10 MG PO TABS: 10.0000 mg | ORAL_TABLET | Freq: Every day | ORAL | 3 refills | Status: DC

## 2020-08-06 NOTE — Assessment & Plan Note (Signed)
C/b hypertriglyceridemia and obesity. Cont metformin 1000 mg bid. Cont diabetic diet. Return 2 months for hgb a1c check. Has nutrition visit tomorrow

## 2020-08-06 NOTE — Patient Instructions (Addendum)
Check blood sugars before breakfast and 2 hours after supper 3-4 x week  Call your doctor for a prescription for:  1. Meter strips (type)  One Touch Verio  checking  3 times per week  2. Lancets (type)  One Touch Delica Plus checking  3      times per week  Exercise:  Begin walking  for    15  minutes   3  days a week and gradually increase to 30 minutes 5 x week  Eat 3 meals day,   1  snack a day Space meals 4-6 hours apart Limit snacks (chips)  Call back if you want to come to Diabetes classes or have another appointment with the dietitian

## 2020-08-06 NOTE — Progress Notes (Signed)
Subjective:     Robert Holland is a 55 y.o. male presenting for Follow-up (1 mo- DM )     HPI  #Diabetes - is on metformin 1000 mg bid - still having Diarrhea but has only been on this dose for about 1 weeks - urinary frequency and thirst has improved - doing Ok with diet - feels he is doing better with portion control - has reduced his carbs  #Unexplained weight loss - has not scheduled colonoscopy - still losing weight - chew tobacco - daily - Last dentist Feb 2022 - no concern for oral cancer  Review of Systems   Social History   Tobacco Use  Smoking Status Never  Smokeless Tobacco Current   Types: Snuff        Objective:    BP Readings from Last 3 Encounters:  08/06/20 118/76  08/06/20 100/64  06/25/20 122/74   Wt Readings from Last 3 Encounters:  08/06/20 275 lb 4.8 oz (124.9 kg)  08/06/20 275 lb 12 oz (125.1 kg)  06/25/20 278 lb (126.1 kg)    BP 100/64   Pulse 100   Temp 97.9 F (36.6 C) (Temporal)   Ht 6' (1.829 m)   Wt 275 lb 12 oz (125.1 kg)   SpO2 97%   BMI 37.40 kg/m    Physical Exam Constitutional:      Appearance: Normal appearance. He is not ill-appearing or diaphoretic.  HENT:     Right Ear: External ear normal.     Left Ear: External ear normal.     Nose: Nose normal.  Eyes:     General: No scleral icterus.    Extraocular Movements: Extraocular movements intact.     Conjunctiva/sclera: Conjunctivae normal.  Cardiovascular:     Rate and Rhythm: Normal rate and regular rhythm.     Heart sounds: No murmur heard. Pulmonary:     Effort: Pulmonary effort is normal. No respiratory distress.     Breath sounds: Normal breath sounds. No wheezing.  Musculoskeletal:     Cervical back: Neck supple.  Skin:    General: Skin is warm and dry.  Neurological:     Mental Status: He is alert. Mental status is at baseline.  Psychiatric:        Mood and Affect: Mood normal.    Diabetic Foot Exam - Simple   Simple Foot Form Diabetic  Foot exam was performed with the following findings: Yes 08/06/2020  2:24 PM  Visual Inspection No deformities, no ulcerations, no other skin breakdown bilaterally: Yes Sensation Testing Intact to touch and monofilament testing bilaterally: Yes Pulse Check Posterior Tibialis and Dorsalis pulse intact bilaterally: Yes Comments          Assessment & Plan:   Problem List Items Addressed This Visit       Endocrine   Type 2 diabetes mellitus with other specified complication ([facility])    C/b hypertriglyceridemia and obesity. Cont metformin 1000 mg bid. Cont diabetic diet. Return 2 months for hgb a1c check. Has nutrition visit tomorrow       Relevant Medications   atorvastatin (LIPITOR) 10 MG tablet     Other   Hyperlipidemia - Primary    Start atorvastatin. Repeat fasting labs with next labs.        Relevant Medications   atorvastatin (LIPITOR) 10 MG tablet   Unexplained weight loss    Weight loss has slowed down. Suspect likely 2/2 to diabetes. Will check psa with next lab draw and pt  will schedule routine colonoscopy       Other Visit Diagnoses     Need for 23-polyvalent pneumococcal polysaccharide vaccine       Relevant Orders   Pneumococcal polysaccharide vaccine 23-valent greater than or equal to 2yo subcutaneous/IM (Completed)        Return in about 2 months (around 10/06/2020) for diabetes and labs - fasting.  Lesleigh Noe, MD  This visit occurred during the SARS-CoV-2 public health emergency.  Safety protocols were in place, including screening questions prior to the visit, additional usage of staff PPE, and extensive cleaning of exam room while observing appropriate contact time as indicated for disinfecting solutions.

## 2020-08-06 NOTE — Patient Instructions (Signed)
Schedule your colonoscopy

## 2020-08-06 NOTE — Assessment & Plan Note (Signed)
Weight loss has slowed down. Suspect likely 2/2 to diabetes. Will check psa with next lab draw and pt will schedule routine colonoscopy

## 2020-08-06 NOTE — Assessment & Plan Note (Signed)
Start atorvastatin. Repeat fasting labs with next labs.

## 2020-08-07 NOTE — Progress Notes (Signed)
Diabetes Self-Management Education  Visit Type: First/Initial  Appt. Start Time: 1525 Appt. End Time: 1630  08/06/2020  [name], identified by name and date of birth, is a 55 y.o. male with a diagnosis of Diabetes: Type 2.   ASSESSMENT  Blood pressure 118/76, height 6' (1.829 m), weight 275 lb 4.8 oz (124.9 kg). Body mass index is 37.34 kg/m.   Diabetes Self-Management Education - 08/06/20 1710       Visit Information   Visit Type First/Initial      Initial Visit   Diabetes Type Type 2    Are you currently following a meal plan? Yes    What type of meal plan do you follow? "lower carbs"    Are you taking your medications as prescribed? Yes    Date Diagnosed 2 months      Health Coping   How would you rate your overall health? Good      Psychosocial Assessment   Patient Belief/Attitude about Diabetes Motivated to manage diabetes    Self-care barriers None    Self-management support Doctor's office;Family    Other persons present Spouse/SO    Patient Concerns Nutrition/Meal planning;Glycemic Control;Weight Control;Medication;Monitoring;Healthy Lifestyle    Special Needs None    Preferred Learning Style Visual    Learning Readiness Change in progress    How often do you need to have someone help you when you read instructions, pamphlets, or other written materials from your doctor or pharmacy? 1 - Never    What is the last grade level you completed in school? 12th      Pre-Education Assessment   Patient understands the diabetes disease and treatment process. Needs Instruction    Patient understands incorporating nutritional management into lifestyle. Needs Instruction    Patient undertands incorporating physical activity into lifestyle. Needs Instruction    Patient understands using medications safely. Needs Instruction    Patient understands monitoring blood glucose, interpreting and using results Needs Instruction    Patient understands prevention, detection,  and treatment of acute complications. Needs Instruction    Patient understands prevention, detection, and treatment of chronic complications. Needs Instruction    Patient understands how to develop strategies to address psychosocial issues. Needs Instruction    Patient understands how to develop strategies to promote health/change behavior. Needs Instruction      Complications   Last HgB A1C per patient/outside source 10.7 %   06/25/2020   How often do you check your blood sugar? 0 times/day (not testing)   Provided One Touch Verio Reflect and instructe on use. BG upon return demonstration was 117 mg/dL at 4:15 pm - 4 hrs pp.   Have you had a dilated eye exam in the past 12 months? Yes    Have you had a dental exam in the past 12 months? Yes    Are you checking your feet? No      Dietary Intake   Breakfast eggs, sausage, bacon; occasional egg sandwich; fruit (bananas, strawberries)    Snack (morning) 0-1 snacks/day - nuts, chips, protein bars    Lunch meat and 2 veggies; sandwich with bologna and cheese or peanut butter and jelly, chips    Dinner chicken, beef, pork, fish; potatoes, peas, beans, corn, rice occasionally, pasta, green beans, salads    Beverage(s) water, coffee, diet soda, tea 1 x week with sugar      Exercise   Exercise Type ADL's      Patient Education   Previous Diabetes Education No  Disease state  Definition of diabetes, type 1 and 2, and the diagnosis of diabetes;Factors that contribute to the development of diabetes    Nutrition management  Role of diet in the treatment of diabetes and the relationship between the three main macronutrients and blood glucose level;Food label reading, portion sizes and measuring food.;Reviewed blood glucose goals for pre and post meals and how to evaluate the patients' food intake on their blood glucose level.    Physical activity and exercise  Role of exercise on diabetes management, blood pressure control and cardiac health.     Medications Reviewed patients medication for diabetes, action, purpose, timing of dose and side effects.    Monitoring Taught/evaluated SMBG meter.;Purpose and frequency of SMBG.;Taught/discussed recording of test results and interpretation of SMBG.;Identified appropriate SMBG and/or A1C goals.    Chronic complications Relationship between chronic complications and blood glucose control    Psychosocial adjustment Identified and addressed patients feelings and concerns about diabetes;Role of stress on diabetes      Individualized Goals (developed by patient)   Reducing Risk Other (comment)   improve blood sugars, decrease medications, prevent diabetes complications, lose weight, lead a healthier lifestyle     Outcomes   Expected Outcomes Demonstrated interest in learning. Expect positive outcomes    Program Status Not Completed             Individualized Plan for Diabetes Self-Management Training:   Learning Objective:  Patient will have a greater understanding of diabetes self-management. Patient education plan is to attend individual and/or group sessions per assessed needs and concerns.   Plan:   Patient Instructions  Check blood sugars before breakfast and 2 hours after supper 3-4 x week  Call your doctor for a prescription for:  1. Meter strips (type)  One Touch Verio  checking  3 times per week  2. Lancets (type)  One Touch Delica Plus checking  3      times per week  Exercise:  Begin walking  for    15  minutes   3  days a week and gradually increase to 30 minutes 5 x week  Eat 3 meals day,   1  snack a day Space meals 4-6 hours apart Limit snacks (chips)  Call back if you want to come to Diabetes classes or have another appointment with the dietitian  Expected Outcomes:  Demonstrated interest in learning. Expect positive outcomes  Education material provided:  General Meal Planning Guidelines Simple Meal Plan   If problems or questions, patient to contact team  via:   Johny Drilling, RN, CCM, [place] [phone]  Future DSME appointment:  The patient doesn't want to return for any further Diabetes education at this time.

## 2020-09-04 ENCOUNTER — Telehealth: Payer: Self-pay

## 2020-09-04 ENCOUNTER — Telehealth: Payer: Self-pay | Admitting: Gastroenterology

## 2020-09-04 ENCOUNTER — Telehealth (INDEPENDENT_AMBULATORY_CARE_PROVIDER_SITE_OTHER): Payer: Self-pay | Admitting: Gastroenterology

## 2020-09-04 DIAGNOSIS — Z86010 Personal history of colonic polyps: Secondary | ICD-10-CM

## 2020-09-04 MED ORDER — CLENPIQ 10-3.5-12 MG-GM -GM/160ML PO SOLN: 1.0000 | Freq: Once | ORAL | 0 refills | Status: AC

## 2020-09-04 NOTE — Telephone Encounter (Signed)
Called patient regarding procedure date. It needs to be moved from the 18th to the 31st, if patient agrees. LVM to call office back.

## 2020-09-04 NOTE — Telephone Encounter (Signed)
Patient agreed to August 31,2022 procedure date

## 2020-09-04 NOTE — Progress Notes (Signed)
Gastroenterology Pre-Procedure Review  Request Date: 10/04/20 Requesting Physician: Dr. Marius Ditch  PATIENT REVIEW QUESTIONS: The patient responded to the following health history questions as indicated:    1. Are you having any GI issues? no 2. Do you have a personal history of Polyps? yes (09/01/2017) 3. Do you have a family history of Colon Cancer or Polyps? no 4. Diabetes Mellitus? no 5. Joint replacements in the past 12 months?no 6. Major health problems in the past 3 months?no 7. Any artificial heart valves, MVP, or defibrillator?no    MEDICATIONS & ALLERGIES:    Patient reports the following regarding taking any anticoagulation/antiplatelet therapy:   Plavix, Coumadin, Eliquis, Xarelto, Lovenox, Pradaxa, Brilinta, or Effient? no Aspirin? no  Patient confirms/reports the following medications:  Current Outpatient Medications  Medication Sig Dispense Refill   atorvastatin (LIPITOR) 10 MG tablet Take 1 tablet (10 mg total) by mouth daily. 90 tablet 3   metFORMIN (GLUCOPHAGE) 500 MG tablet Take 2 tablets (1,000 mg total) by mouth 2 (two) times daily with a meal. 360 tablet 1   Multiple Vitamin (MULTIVITAMIN) tablet Take 1 tablet by mouth daily.     No current facility-administered medications for this visit.    Patient confirms/reports the following allergies:  No Known Allergies  No orders of the defined types were placed in this encounter.   AUTHORIZATION INFORMATION Primary Insurance: 1D#: Group #:  Secondary Insurance: 1D#: Group #:  SCHEDULE INFORMATION: Date: 10/04/20 Time: Location: [place]

## 2020-09-04 NOTE — Telephone Encounter (Signed)
Endo unit has been notified of the date switch. Vickie states she will move the procedure date to 8/31.

## 2020-10-08 ENCOUNTER — Other Ambulatory Visit: Payer: Self-pay

## 2020-10-08 ENCOUNTER — Encounter: Payer: Self-pay | Admitting: Family Medicine

## 2020-10-08 ENCOUNTER — Ambulatory Visit: Payer: BC Managed Care – PPO | Admitting: Family Medicine

## 2020-10-08 VITALS — BP 110/76 | HR 73 | Temp 97.4°F | Ht 72.0 in | Wt 274.0 lb

## 2020-10-08 DIAGNOSIS — Z125 Encounter for screening for malignant neoplasm of prostate: Secondary | ICD-10-CM

## 2020-10-08 DIAGNOSIS — E669 Obesity, unspecified: Secondary | ICD-10-CM | POA: Diagnosis not present

## 2020-10-08 DIAGNOSIS — E785 Hyperlipidemia, unspecified: Secondary | ICD-10-CM | POA: Diagnosis not present

## 2020-10-08 DIAGNOSIS — E1169 Type 2 diabetes mellitus with other specified complication: Secondary | ICD-10-CM

## 2020-10-08 LAB — COMPREHENSIVE METABOLIC PANEL
ALT: 83 U/L — ABNORMAL HIGH (ref 0–53)
AST: 53 U/L — ABNORMAL HIGH (ref 0–37)
Albumin: 4.2 g/dL (ref 3.5–5.2)
Alkaline Phosphatase: 62 U/L (ref 39–117)
BUN: 16 mg/dL (ref 6–23)
CO2: 29 mEq/L (ref 19–32)
Calcium: 9.1 mg/dL (ref 8.4–10.5)
Chloride: 103 mEq/L (ref 96–112)
Creatinine, Ser: 0.92 mg/dL (ref 0.40–1.50)
GFR: 93.41 mL/min (ref >60.00)
Glucose, Bld: 112 mg/dL — ABNORMAL HIGH (ref 70–99)
Potassium: 4.9 mEq/L (ref 3.5–5.1)
Sodium: 139 mEq/L (ref 135–145)
Total Bilirubin: 0.8 mg/dL (ref 0.2–1.2)
Total Protein: 6.8 g/dL (ref 6.0–8.3)

## 2020-10-08 LAB — CBC
HCT: 45.5 % (ref 39.0–52.0)
Hemoglobin: 15.5 g/dL (ref 13.0–17.0)
MCHC: 34.1 g/dL (ref 30.0–36.0)
MCV: 90.7 fl (ref 78.0–100.0)
Platelets: 171 10*3/uL (ref 150.0–400.0)
RBC: 5.02 Mil/uL (ref 4.22–5.81)
RDW: 12.7 % (ref 11.5–15.5)
WBC: 6 10*3/uL (ref 4.0–10.5)

## 2020-10-08 LAB — LIPID PANEL
Cholesterol: 113 mg/dL (ref 0–200)
HDL: 36.3 mg/dL — ABNORMAL LOW (ref >39.00)
LDL Cholesterol: 40 mg/dL (ref 0–99)
NonHDL: 76.77
Total CHOL/HDL Ratio: 3
Triglycerides: 182 mg/dL — ABNORMAL HIGH (ref 0.0–149.0)
VLDL: 36.4 mg/dL (ref 0.0–40.0)

## 2020-10-08 LAB — POCT GLYCOSYLATED HEMOGLOBIN (HGB A1C): Hemoglobin A1C: 6 % — AB (ref 4.0–5.6)

## 2020-10-08 LAB — PSA: PSA: 0.43 ng/mL (ref 0.10–4.00)

## 2020-10-08 NOTE — Assessment & Plan Note (Signed)
Encouraged diabetic diet adherence and exercise

## 2020-10-08 NOTE — Assessment & Plan Note (Signed)
Lab Results  Component Value Date   HGBA1C 6.0 (A) 10/08/2020   Significant improvement. Decrease metformin to 1000 mg AM and 500 mg PM due to improvement and diarrhea. Return 3 months for poc hgba1c check and 6 month for office visit unless greater than 7.5. Cont statin

## 2020-10-08 NOTE — Patient Instructions (Addendum)
#  diabetes - Metformin - Decrease dose to 2 pills in the morning and 1 pill in the evening - continue to work on diet   Return for lab appointment in 3 months for hemoglobin A1c  If Hemoglobin A1c is <7.5 plan to see me in 6 months  If Hemoglobin A1c is >7.5 -- schedule office visit with either Allie Bossier or [facility]

## 2020-10-08 NOTE — Assessment & Plan Note (Signed)
Fasting today. Taking atorvastatin. Monitor for improvement

## 2020-10-08 NOTE — Progress Notes (Signed)
Subjective:     Robert Holland is a 55 y.o. male presenting for 2 Month Follow-up Diabetes and Labs     HPI  #Diabetes Currently taking metformin (Glucophage, [place])  Using medications without difficulties: No Hypoglycemic episodes:No  Hyperglycemic episodes:No  Feet problems:No  Blood Sugars averaging: does not check Last HgbA1c:  Lab Results  Component Value Date   HGBA1C 6.0 (A) 10/08/2020    Normal BM 3 per day, with medication is having 7-8 BM per day with metformin, denies dehydration   Diet: not very good, has been trying but hard to maintain Exercise: active job  Diabetes Health Maintenance Due:    Diabetes Health Maintenance Due  Topic Date Due   OPHTHALMOLOGY EXAM  Never done   HEMOGLOBIN A1C  04/10/2021   URINE MICROALBUMIN  06/25/2021   FOOT EXAM  08/06/2021      Review of Systems   Social History   Tobacco Use  Smoking Status Never  Smokeless Tobacco Current   Types: Snuff        Objective:    BP Readings from Last 3 Encounters:  10/08/20 110/76  08/06/20 118/76  08/06/20 100/64   Wt Readings from Last 3 Encounters:  10/08/20 274 lb (124.3 kg)  08/06/20 275 lb 4.8 oz (124.9 kg)  08/06/20 275 lb 12 oz (125.1 kg)    BP 110/76 (BP Location: Left Arm, Patient Position: Sitting, Cuff Size: Large)   Pulse 73   Temp (!) 97.4 F (36.3 C)   Ht 6' (1.829 m)   Wt 274 lb (124.3 kg)   SpO2 97%   BMI 37.16 kg/m    Physical Exam Constitutional:      Appearance: Normal appearance. He is not ill-appearing or diaphoretic.  HENT:     Right Ear: External ear normal.     Left Ear: External ear normal.  Eyes:     General: No scleral icterus.    Extraocular Movements: Extraocular movements intact.     Conjunctiva/sclera: Conjunctivae normal.  Cardiovascular:     Rate and Rhythm: Normal rate and regular rhythm.  Pulmonary:     Effort: Pulmonary effort is normal. No respiratory distress.     Breath sounds: Normal breath sounds. No  wheezing.  Musculoskeletal:     Cervical back: Neck supple.  Skin:    General: Skin is warm and dry.  Neurological:     Mental Status: He is alert. Mental status is at baseline.  Psychiatric:        Mood and Affect: Mood normal.          Assessment & Plan:   Problem List Items Addressed This Visit       Endocrine   Type 2 diabetes mellitus with other specified complication ([place]) - Primary    Lab Results  Component Value Date   HGBA1C 6.0 (A) 10/08/2020  Significant improvement. Decrease metformin to 1000 mg AM and 500 mg PM due to improvement and diarrhea. Return 3 months for poc hgba1c check and 6 month for office visit unless greater than 7.5. Cont statin       Relevant Orders   HgB A1c (Completed)   Comprehensive metabolic panel   CBC   POCT glycosylated hemoglobin (Hb A1C)     Other   Obesity (BMI 35.0-39.9 without comorbidity)    Encouraged diabetic diet adherence and exercise      Relevant Orders   Comprehensive metabolic panel   CBC   Hyperlipidemia    Fasting today. Taking  atorvastatin. Monitor for improvement      Relevant Orders   Lipid panel   Other Visit Diagnoses     Screening for prostate cancer       Relevant Orders   PSA        Return in about 6 months (around 04/10/2021) for Diabetes.  Lesleigh Noe, MD  This visit occurred during the SARS-CoV-2 public health emergency.  Safety protocols were in place, including screening questions prior to the visit, additional usage of staff PPE, and extensive cleaning of exam room while observing appropriate contact time as indicated for disinfecting solutions.

## 2020-10-16 NOTE — Telephone Encounter (Signed)
Open in error

## 2020-10-17 ENCOUNTER — Ambulatory Visit
Admission: RE | Admit: 2020-10-17 | Discharge: 2020-10-17 | Disposition: A | Discharge status: Home / Self Care | Payer: BC Managed Care – PPO | Attending: Gastroenterology | Admitting: Gastroenterology

## 2020-10-17 ENCOUNTER — Encounter
Admission: RE | Disposition: A | Discharge status: Home / Self Care | Payer: Self-pay | Source: Home / Self Care | Attending: Gastroenterology

## 2020-10-17 ENCOUNTER — Ambulatory Visit: Payer: BC Managed Care – PPO | Admitting: Anesthesiology

## 2020-10-17 ENCOUNTER — Encounter: Payer: Self-pay | Admitting: Gastroenterology

## 2020-10-17 DIAGNOSIS — D124 Benign neoplasm of descending colon: Secondary | ICD-10-CM | POA: Insufficient documentation

## 2020-10-17 DIAGNOSIS — D125 Benign neoplasm of sigmoid colon: Secondary | ICD-10-CM | POA: Diagnosis not present

## 2020-10-17 DIAGNOSIS — E119 Type 2 diabetes mellitus without complications: Secondary | ICD-10-CM | POA: Insufficient documentation

## 2020-10-17 DIAGNOSIS — Z7984 Long term (current) use of oral hypoglycemic drugs: Secondary | ICD-10-CM | POA: Diagnosis not present

## 2020-10-17 DIAGNOSIS — Z860100 Personal history of colon polyps, unspecified: Secondary | ICD-10-CM

## 2020-10-17 DIAGNOSIS — D123 Benign neoplasm of transverse colon: Secondary | ICD-10-CM | POA: Diagnosis not present

## 2020-10-17 DIAGNOSIS — Z808 Family history of malignant neoplasm of other organs or systems: Secondary | ICD-10-CM | POA: Diagnosis not present

## 2020-10-17 DIAGNOSIS — Z8379 Family history of other diseases of the digestive system: Secondary | ICD-10-CM | POA: Diagnosis not present

## 2020-10-17 DIAGNOSIS — Z79899 Other long term (current) drug therapy: Secondary | ICD-10-CM | POA: Insufficient documentation

## 2020-10-17 DIAGNOSIS — K5730 Diverticulosis of large intestine without perforation or abscess without bleeding: Secondary | ICD-10-CM | POA: Diagnosis not present

## 2020-10-17 DIAGNOSIS — Z1211 Encounter for screening for malignant neoplasm of colon: Secondary | ICD-10-CM | POA: Insufficient documentation

## 2020-10-17 DIAGNOSIS — K635 Polyp of colon: Secondary | ICD-10-CM

## 2020-10-17 DIAGNOSIS — Z833 Family history of diabetes mellitus: Secondary | ICD-10-CM | POA: Diagnosis not present

## 2020-10-17 DIAGNOSIS — E1169 Type 2 diabetes mellitus with other specified complication: Secondary | ICD-10-CM

## 2020-10-17 DIAGNOSIS — Z803 Family history of malignant neoplasm of breast: Secondary | ICD-10-CM | POA: Insufficient documentation

## 2020-10-17 HISTORY — PX: COLONOSCOPY WITH PROPOFOL: SHX5780

## 2020-10-17 LAB — GLUCOSE, CAPILLARY: Glucose-Capillary: 114 mg/dL — ABNORMAL HIGH (ref 70–99)

## 2020-10-17 SURGERY — COLONOSCOPY WITH PROPOFOL
Anesthesia: General

## 2020-10-17 MED ORDER — SODIUM CHLORIDE 0.9 % IV SOLN
INTRAVENOUS | Status: DC
  Administered 2020-10-17: 20 mL/h via INTRAVENOUS

## 2020-10-17 MED ORDER — PROPOFOL 500 MG/50ML IV EMUL
INTRAVENOUS | Status: DC | PRN
  Administered 2020-10-17: 175 ug/kg/min via INTRAVENOUS

## 2020-10-17 MED ORDER — LIDOCAINE HCL (CARDIAC) PF 100 MG/5ML IV SOSY
PREFILLED_SYRINGE | INTRAVENOUS | Status: DC | PRN
  Administered 2020-10-17: 50 mg via INTRAVENOUS

## 2020-10-17 MED ORDER — PROPOFOL 10 MG/ML IV BOLUS
INTRAVENOUS | Status: DC | PRN
  Administered 2020-10-17: 80 mg via INTRAVENOUS
  Administered 2020-10-17: 20 mg via INTRAVENOUS

## 2020-10-17 MED ORDER — PROPOFOL 500 MG/50ML IV EMUL
INTRAVENOUS | Status: AC
  Filled 2020-10-17: qty 50

## 2020-10-17 MED ORDER — LIDOCAINE HCL (PF) 2 % IJ SOLN
INTRAMUSCULAR | Status: AC
  Filled 2020-10-17: qty 5

## 2020-10-17 NOTE — Anesthesia Preprocedure Evaluation (Signed)
Anesthesia Evaluation  Patient identified by MRN, date of birth, ID band Patient awake    Reviewed: Allergy & Precautions, H&P , NPO status , Patient's Chart, lab work & pertinent test results, reviewed documented beta blocker date and time   History of Anesthesia Complications Negative for: history of anesthetic complications  Airway Mallampati: II  TM Distance: >3 FB Neck ROM: full    Dental  (+) Dental Advidsory Given, Caps, Chipped   Pulmonary neg pulmonary ROS, neg shortness of breath,    Pulmonary exam normal        Cardiovascular Exercise Tolerance: Good (-) anginanegative cardio ROS Normal cardiovascular exam     Neuro/Psych negative neurological ROS  negative psych ROS   GI/Hepatic negative GI ROS, Neg liver ROS, neg GERD  ,  Endo/Other  diabetes, Type 2  Renal/GU negative Renal ROS  negative genitourinary   Musculoskeletal   Abdominal   Peds  Hematology negative hematology ROS (+)   Anesthesia Other Findings Past Medical History: No date: Back pain No date: Diabetes mellitus without complication ([place]) 0000000: Genital warts No date: Hyperlipidemia No date: Joint pain   Reproductive/Obstetrics negative OB ROS                             Anesthesia Physical  Anesthesia Plan  ASA: 3  Anesthesia Plan: General   Post-op Pain Management:    Induction: Intravenous  PONV Risk Score and Plan: 2 and Propofol infusion and TIVA  Airway Management Planned: Nasal Cannula  Additional Equipment:   Intra-op Plan:   Post-operative Plan:   Informed Consent: I have reviewed the patients History and Physical, chart, labs and discussed the procedure including the risks, benefits and alternatives for the proposed anesthesia with the patient or authorized representative who has indicated his/her understanding and acceptance.     Dental Advisory Given  Plan Discussed with:  Anesthesiologist, CRNA and Surgeon  Anesthesia Plan Comments: (Patient consented for risks of anesthesia including but not limited to:  - adverse reactions to medications - risk of airway placement if required - damage to eyes, teeth, lips or other oral mucosa - nerve damage due to positioning  - sore throat or hoarseness - Damage to heart, brain, nerves, lungs, other parts of body or loss of life  Patient voiced understanding.)        Anesthesia Quick Evaluation

## 2020-10-17 NOTE — Op Note (Signed)
[facility] Patient Name: Robert Holland Procedure Date: 10/17/2020 8:25 AM MRN: [id number] Account #: [id number] Date of Birth: Jun 13, 1965 Admit Type: Outpatient Age: 55 Room: [facility] ENDO ROOM 4 Gender: Male Note Status: Finalized Procedure:             Colonoscopy Indications:           Surveillance: Personal history of adenomatous polyps                         on last colonoscopy 3 years ago, Last colonoscopy:                         July 2019 Providers:             Lin Landsman MD, MD Referring MD:          Jobe Marker. Einar Pheasant (Referring MD) Medicines:             General Anesthesia Complications:         No immediate complications. Estimated blood loss: None. Procedure:             Pre-Anesthesia Assessment:                        - Prior to the procedure, a History and Physical was                         performed, and patient medications and allergies were                         reviewed. The patient is competent. The risks and                         benefits of the procedure and the sedation options and                         risks were discussed with the patient. All questions                         were answered and informed consent was obtained.                         Patient identification and proposed procedure were                         verified by the physician, the nurse, the                         anesthesiologist, the anesthetist and the technician                         in the pre-procedure area in the procedure room in the                         endoscopy suite. Mental Status Examination: alert and                         oriented. Airway Examination: normal oropharyngeal  airway and neck mobility. Respiratory Examination:                         clear to auscultation. CV Examination: normal.                         Prophylactic Antibiotics: The patient does not require                          prophylactic antibiotics. Prior Anticoagulants: The                         patient has taken no previous anticoagulant or                         antiplatelet agents. ASA Grade Assessment: III - A                         patient with severe systemic disease. After reviewing                         the risks and benefits, the patient was deemed in                         satisfactory condition to undergo the procedure. The                         anesthesia plan was to use general anesthesia.                         Immediately prior to administration of medications,                         the patient was re-assessed for adequacy to receive                         sedatives. The heart rate, respiratory rate, oxygen                         saturations, blood pressure, adequacy of pulmonary                         ventilation, and response to care were monitored                         throughout the procedure. The physical status of the                         patient was re-assessed after the procedure.                        After obtaining informed consent, the colonoscope was                         passed under direct vision. Throughout the procedure,                         the patient's blood pressure, pulse, and oxygen  saturations were monitored continuously. The                         Colonoscope was introduced through the anus and                         advanced to the the cecum, identified by appendiceal                         orifice and ileocecal valve. The colonoscopy was                         unusually difficult due to inadequate bowel prep,                         significant looping and the patient's body habitus.                         Successful completion of the procedure was aided by                         applying abdominal pressure and lavage. The patient                         tolerated the procedure well. The quality of the bowel                          preparation was evaluated using the BBPS [facility] Bowel                         Preparation Scale) with scores of: Right Colon = 2                         (minor amount of residual staining, small fragments of                         stool and/or opaque liquid, but mucosa seen well),                         Transverse Colon = 2 (minor amount of residual                         staining, small fragments of stool and/or opaque                         liquid, but mucosa seen well) and Left Colon = 2                         (minor amount of residual staining, small fragments of                         stool and/or opaque liquid, but mucosa seen well). The                         total BBPS score equals 6. Findings:      The perianal and digital rectal examinations were normal. Pertinent       negatives  include normal sphincter tone and no palpable rectal lesions.      Seven sessile polyps were found in the sigmoid colon, descending colon       and transverse colon. The polyps were 3 to 5 mm in size. These polyps       were removed with a cold snare. Resection and retrieval were complete.       Estimated blood loss was minimal.      Copious quantities of liquid stool was found in the entire colon,       precluding visualization. Lavage of the area was performed using a large       amount of sterile water, resulting in clearance with fair visualization.      A few diverticula were found in the sigmoid colon.      The retroflexed view of the distal rectum and anal verge was normal and       showed no anal or rectal abnormalities. Impression:            - Seven 3 to 5 mm polyps in the sigmoid colon 3, in                         the descending colon 1 and in the transverse colon 3,                         removed with a cold snare. Resected and retrieved.                        - Stool in the entire examined colon.                        - Diverticulosis in the sigmoid colon.                         - The distal rectum and anal verge are normal on                         retroflexion view. Recommendation:        - Discharge patient to home (with escort).                        - Resume previous diet today.                        - Continue present medications.                        - Await pathology results.                        - Repeat colonoscopy in 3 years for surveillance of                         multiple polyps. Procedure Code(s):     --- Professional ---                        [phone], Colonoscopy, flexible; with removal of                         tumor(s), polyp(s), or other lesion(s) by snare  technique Diagnosis Code(s):     --- Professional ---                        Z86.010, Personal history of colonic polyps                        K63.5, Polyp of colon                        K57.30, Diverticulosis of large intestine without                         perforation or abscess without bleeding CPT copyright 2019 American Medical Association. All rights reserved. The codes documented in this report are preliminary and upon coder review may  be revised to meet current compliance requirements. Dr. Ulyess Mort Lin Landsman MD, MD 10/17/2020 9:00:48 AM This report has been signed electronically. Number of Addenda: 0 Note Initiated On: 10/17/2020 8:25 AM Scope Withdrawal Time: 0 hours 22 minutes 5 seconds  Total Procedure Duration: 0 hours 25 minutes 58 seconds  Estimated Blood Loss:  Estimated blood loss: none.      [facility]

## 2020-10-17 NOTE — Anesthesia Postprocedure Evaluation (Signed)
Anesthesia Post Note  Patient: Robert Holland  Procedure(s) Performed: COLONOSCOPY WITH PROPOFOL  Patient location during evaluation: Endoscopy Anesthesia Type: General Level of consciousness: awake and alert Pain management: pain level controlled Vital Signs Assessment: post-procedure vital signs reviewed and stable Respiratory status: spontaneous breathing, nonlabored ventilation, respiratory function stable and patient connected to nasal cannula oxygen Cardiovascular status: blood pressure returned to baseline and stable Postop Assessment: no apparent nausea or vomiting Anesthetic complications: no   No notable events documented.   Last Vitals:  Vitals:   10/17/20 0922 10/17/20 0923  BP:  119/77  Pulse: 70 71  Resp: 15 19  Temp:    SpO2: 98% 99%    Last Pain:  Vitals:   10/17/20 0923  TempSrc:   PainSc: 0-No pain                 [name]

## 2020-10-17 NOTE — Anesthesia Procedure Notes (Signed)
Date/Time: 10/17/2020 8:28 AM Performed by: Johnna Acosta, CRNA Pre-anesthesia Checklist: Patient identified, Emergency Drugs available, Suction available, Patient being monitored and Timeout performed Patient Re-evaluated:Patient Re-evaluated prior to induction Oxygen Delivery Method: Nasal cannula Preoxygenation: Pre-oxygenation with 100% oxygen Induction Type: IV induction

## 2020-10-17 NOTE — Transfer of Care (Signed)
Immediate Anesthesia Transfer of Care Note  Patient: Robert Holland  Procedure(s) Performed: COLONOSCOPY WITH PROPOFOL  Patient Location: PACU  Anesthesia Type:General  Level of Consciousness: sedated  Airway & Oxygen Therapy: Patient Spontanous Breathing  Post-op Assessment: Report given to RN and Post -op Vital signs reviewed and stable  Post vital signs: Reviewed and stable  Last Vitals:  Vitals Value Taken Time  BP 109/74 10/17/20 0903  Temp    Pulse 84 10/17/20 0903  Resp 14 10/17/20 0903  SpO2 94 % 10/17/20 0903  Vitals shown include unvalidated device data.  Last Pain:  Vitals:   10/17/20 0721  TempSrc: Temporal  PainSc: 0-No pain         Complications: No notable events documented.

## 2020-10-17 NOTE — H&P (Signed)
Robert Darby, MD [address]  [place]  [place],  [address]  Main: [phone]  Fax: [phone] Pager: [phone]  Primary Care Physician:  Lesleigh Noe, MD Primary Gastroenterologist:  Dr. Cephas Holland  Pre-Procedure History & Physical: HPI:  Robert Holland is a 55 y.o. male is here for an colonoscopy.   Past Medical History:  Diagnosis Date   Back pain    Diabetes mellitus without complication ([place])    Genital warts 2017   Hyperlipidemia    Joint pain     Past Surgical History:  Procedure Laterality Date   COLONOSCOPY WITH PROPOFOL N/A 09/01/2017   Procedure: COLONOSCOPY WITH PROPOFOL;  Surgeon: Lin Landsman, MD;  Location: [facility] ENDOSCOPY;  Service: Gastroenterology;  Laterality: N/A;   HERNIA REPAIR  2016   herniated disk fusion   2003    Prior to Admission medications   Medication Sig Start Date End Date Taking? Authorizing Provider  atorvastatin (LIPITOR) 10 MG tablet Take 1 tablet (10 mg total) by mouth daily. 08/06/20  Yes Lesleigh Noe, MD  metFORMIN (GLUCOPHAGE) 500 MG tablet Take 2 tablets (1,000 mg total) by mouth 2 (two) times daily with a meal. 06/25/20  Yes Lesleigh Noe, MD  Multiple Vitamin (MULTIVITAMIN) tablet Take 1 tablet by mouth daily.   Yes [provider]    Allergies as of 09/04/2020   (No Known Allergies)    Family History  Problem Relation Age of Onset   Diabetes Mother    Depression Mother    Liver disease Mother    Cancer Father        brain tumor   Breast cancer Maternal Grandmother    Breast cancer Paternal Grandmother     Social History   Socioeconomic History   Marital status: Married    Spouse name: Cordella Register   Number of children: 2   Years of education: high school   Highest education level: Not on file  Occupational History   Occupation: Psychologist, sport and exercise  Tobacco Use   Smoking status: Never   Smokeless tobacco: Current    Types: Snuff  Vaping Use   Vaping Use:  Never used  Substance and Sexual Activity   Alcohol use: Not Currently   Drug use: Never   Sexual activity: Yes    Partners: Female    Birth control/protection: Post-menopausal  Other Topics Concern   Not on file  Social History Narrative   05/24/20   From: the area   Living: with wife, Stanton Kidney (2019) and his step son and daughter-in-law   Work: Theatre manager, previously worked in the prison      Family: 2 children - Karie Soda and Museum/gallery conservator - 3 grandchildren/ 2 step children      Enjoys: fish, golf, hunt, go shopping with wife      Exercise: active job    Diet: not great, but hoping to make a change      Safety   Seat belts: Yes    Guns: Yes  and secure   Safe in relationships: Yes    Social Determinants of Radio broadcast assistant Strain: Not on file  Food Insecurity: Not on file  Transportation Needs: Not on file  Physical Activity: Not on file  Stress: Not on file  Social Connections: Not on file  Intimate Partner Violence: Not on file    Review of Systems: See HPI, otherwise negative ROS  Physical Exam: BP (!) 154/85  Pulse 82   Temp (!) 95.7 F (35.4 C) (Temporal)   Resp 20   Ht '6\' 1"'$  (1.854 m)   Wt 122.5 kg   SpO2 97%   BMI 35.62 kg/m  General:   Alert,  pleasant and cooperative in NAD Head:  Normocephalic and atraumatic. Neck:  Supple; no masses or thyromegaly. Lungs:  Clear throughout to auscultation.    Heart:  Regular rate and rhythm. Abdomen:  Soft, nontender and nondistended. Normal bowel sounds, without guarding, and without rebound.   Neurologic:  Alert and  oriented x4;  grossly normal neurologically.  Impression/Plan: Robert Holland is here for an colonoscopy to be performed for personal h/o colon adenomas  Risks, benefits, limitations, and alternatives regarding  colonoscopy have been reviewed with the patient.  Questions have been answered.  All parties agreeable.   Sherri Sear, MD  10/17/2020, 7:48 AM

## 2020-10-18 ENCOUNTER — Encounter: Payer: Self-pay | Admitting: Gastroenterology

## 2020-10-18 LAB — SURGICAL PATHOLOGY

## 2020-10-20 ENCOUNTER — Encounter: Payer: Self-pay | Admitting: Gastroenterology

## 2020-10-23 ENCOUNTER — Telehealth: Payer: Self-pay | Admitting: Genetic Counselor

## 2020-10-23 ENCOUNTER — Telehealth: Payer: Self-pay

## 2020-10-23 DIAGNOSIS — Z86010 Personal history of colonic polyps: Secondary | ICD-10-CM

## 2020-10-23 NOTE — Telephone Encounter (Signed)
Scheduled appt per 9/6 referral. Pt is aware of appt date and time.

## 2020-10-23 NOTE — Telephone Encounter (Signed)
-----   Message from Lin Landsman, MD sent at 10/20/2020 11:22 AM EDT ----- With history of more than 10 precancerous polyps, recommend referral to genetics at cancer center to determine if he has any abnormal genes that increases his risk for colon cancer if patient is agreeable  RV

## 2020-10-23 NOTE — Telephone Encounter (Signed)
Patient is okay with referral placed referral

## 2020-11-08 ENCOUNTER — Inpatient Hospital Stay: Payer: BC Managed Care – PPO | Attending: Family Medicine | Admitting: Genetic Counselor

## 2020-11-08 ENCOUNTER — Other Ambulatory Visit: Payer: Self-pay

## 2020-11-08 ENCOUNTER — Inpatient Hospital Stay: Payer: BC Managed Care – PPO

## 2020-11-08 ENCOUNTER — Encounter: Payer: Self-pay | Admitting: Genetic Counselor

## 2020-11-08 DIAGNOSIS — Z86010 Personal history of colonic polyps: Secondary | ICD-10-CM

## 2020-11-08 DIAGNOSIS — Z803 Family history of malignant neoplasm of breast: Secondary | ICD-10-CM | POA: Diagnosis not present

## 2020-11-08 NOTE — Progress Notes (Signed)
REFERRING PROVIDER: Lin Landsman, MD [address] [place],  [place] [address]  PRIMARY PROVIDER:  Lesleigh Noe, MD  PRIMARY REASON FOR VISIT:  1. Personal history of colonic polyps   2. Family history of breast cancer     HISTORY OF PRESENT ILLNESS:   Robert Holland, a 55 y.o. male, was seen for a [facility] cancer genetics consultation at the request of Dr. Marius Ditch due to a personal history of colon polyps.  Robert Holland presents to clinic today to discuss the possibility of a hereditary predisposition to cancer and colon polyps, to discuss genetic testing, and to further clarify his future cancer risks, as well as potential cancer risks for family members.   Robert Holland is a 55 y.o. male with no personal history of cancer.  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  2019 colonoscopy detected five polyps, and 2022 colonoscopy detected seven polyps.   CANCER HISTORY:  Oncology History   No history exists.     RISK FACTORS:  Colonoscopy: yes;  see history noted above . Prostate cancer screening: yes; annually  Any excessive radiation exposure in the past: no  Past Medical History:  Diagnosis Date   Back pain    Diabetes mellitus without complication ([place])    Genital warts 2017   Hyperlipidemia    Joint pain     Past Surgical History:  Procedure Laterality Date   COLONOSCOPY WITH PROPOFOL N/A 09/01/2017   Procedure: COLONOSCOPY WITH PROPOFOL;  Surgeon: Lin Landsman, MD;  Location: [facility] ENDOSCOPY;  Service: Gastroenterology;  Laterality: N/A;   COLONOSCOPY WITH PROPOFOL N/A 10/17/2020   Procedure: COLONOSCOPY WITH PROPOFOL;  Surgeon: Lin Landsman, MD;  Location: [facility] ENDOSCOPY;  Service: Gastroenterology;  Laterality: N/A;   HERNIA REPAIR  2016   herniated disk fusion   2003    Social History   Socioeconomic History   Marital status: Married    Spouse name: Cordella Register   Number of children: 2   Years of education: high school   Highest  education level: Not on file  Occupational History   Occupation: Psychologist, sport and exercise  Tobacco Use   Smoking status: Never   Smokeless tobacco: Current    Types: Snuff  Vaping Use   Vaping Use: Never used  Substance and Sexual Activity   Alcohol use: Not Currently   Drug use: Never   Sexual activity: Yes    Partners: Female  Other Topics Concern   Not on file  Social History Narrative   05/24/20   From: the area   Living: with wife, Stanton Kidney (2019) and his step son and daughter-in-law   Work: Theatre manager, previously worked in the prison      Family: 2 children - Karie Soda and Museum/gallery conservator - 3 grandchildren/ 2 step children      Enjoys: fish, golf, hunt, go shopping with wife      Exercise: active job    Diet: not great, but hoping to make a change      Safety   Seat belts: Yes    Guns: Yes  and secure   Safe in relationships: Yes    Social Determinants of Radio broadcast assistant Strain: Not on file  Food Insecurity: Not on file  Transportation Needs: Not on file  Physical Activity: Not on file  Stress: Not on file  Social Connections: Not on file     FAMILY HISTORY:  We obtained a detailed, 4-generation  family history.  Significant diagnoses are listed below: Family History  Problem Relation Age of Onset   Cancer Father 28       brain tumor   Breast cancer Paternal Aunt        dx after 47   Bone cancer Paternal Uncle        dx after 56   Breast cancer Maternal Grandmother        dx 53s   Breast cancer Paternal Grandmother        dx early 38s   Breast cancer Cousin        paternal first cousin; dx 30s-40s    Robert Holland is unaware of previous family history of genetic testing for hereditary cancer risks. There is no reported Ashkenazi Jewish ancestry. There is no known consanguinity.  GENETIC COUNSELING ASSESSMENT: Robert Holland is a 55 y.o. male with a personal history of polyps which is somewhat suggestive of a hereditary  cancer/polyposis syndrome and predisposition to cancer given his lifetime number of tubular adenomas.  His paternal family history of breast cancer is somewheat suggestive of a hereditary cancer syndrome given the number of affected relatives and their ages at diagnosis. We, therefore, discussed and recommended the following at today's visit.   DISCUSSION: We discussed that polyps in general are common, however, most people have fewer than 5 lifetime polyps.  When an individual has 10 or more polyps we become concerned about an underlying polyposis syndrome.  The most common hereditary polyposis syndromes are Familial Adenomatous Polyposis (FAP), caused by mutations in the APC gene, and MUTYH-Associated Polyposis (MAP), caused by mutations in the MUTYH gene.  There are other genes that are associated with polyposis.  We also discussed that 5 - 10% of cancer is hereditary, with most cases of hereditary breast cancer associated with mutations in BRCA1/2.  There are other genes that can be associated with hereditary breast cancer syndromes.  We discussed that testing is beneficial for several reasons, including knowing about other cancer risks, identifying potential screening and risk-reduction options that may be appropriate, and to understanding if other family members could be at risk for cancer or colon polyps and allowing them to undergo genetic testing.    We reviewed the characteristics, features and inheritance patterns of hereditary cancer syndromes. We also discussed genetic testing, including the appropriate family members to test, the process of testing, insurance coverage and turn-around-time for results. We discussed the implications of a negative, positive, carrier and/or variant of uncertain significant result. We recommended Robert Holland pursue genetic testing for a panel that contains genes associated with polyposis and breast cancer.  Robert Holland was offered a common hereditary cancer panel (47  genes) and an expanded pan-cancer panel (77 genes). Robert Holland was informed of the benefits and limitations of each panel, including that expanded pan-cancer panels contain several genes that do not have clear management guidelines at this point in time.  We also discussed that as the number of genes included on a panel increases, the chances of variants of uncertain significance increases.  After considering the benefits and limitations of each gene panel, Robert Holland elected to have an expanded pan-cancer panel through [place].   The CancerNext-Expanded gene panel offered by [facility] and includes sequencing, rearrangement, and RNA analysis for the following 77 genes: AIP, ALK, APC, ATM, AXIN2, BAP1, BARD1, BLM, BMPR1A, BRCA1, BRCA2, BRIP1, CDC73, CDH1, CDK4, CDKN1B, CDKN2A, CHEK2, CTNNA1, DICER1, FANCC, FH, FLCN, GALNT12, KIF1B, LZTR1, MAX, MEN1, MET, MLH1, MSH2, MSH3,  MSH6, MUTYH, NBN, NF1, NF2, NTHL1, PALB2, PHOX2B, PMS2, POT1, PRKAR1A, PTCH1, PTEN, RAD51C, RAD51D, RB1, RECQL, RET, SDHA, SDHAF2, SDHB, SDHC, SDHD, SMAD4, SMARCA4, SMARCB1, SMARCE1, STK11, SUFU, TMEM127, TP53, TSC1, TSC2, VHL and XRCC2 (sequencing and deletion/duplication); EGFR, EGLN1, HOXB13, KIT, MITF, PDGFRA, POLD1, and POLE (sequencing only); EPCAM and GREM1 (deletion/duplication only).   Based on Robert Holland's personal history of colon polyps and family history of breast cancer, he meets medical criteria for genetic testing. Despite that he meets criteria, he may still have an out of pocket cost. We discussed that if his out of pocket cost for testing is over $100, the laboratory should contact him to discuss self-pay options and patient pay assistance programs.   We discussed that some people do not want to undergo genetic testing due to fear of genetic discrimination.  A federal law called the Genetic Information Non-Discrimination Act (GINA) of 2008 helps protect individuals against genetic discrimination based on their genetic  test results.  It impacts both health insurance and employment.  With health insurance, it protects against increased premiums, being kicked off insurance or being forced to take a test in order to be insured.  For employment it protects against hiring, firing and promoting decisions based on genetic test results.  GINA does not apply to those in the [employer], those who work for companies with less than 15 employees, and new life insurance or long-term disability insurance policies.  Health status due to a cancer diagnosis is not protected under GINA.  PLAN: After considering the risks, benefits, and limitations, Robert Holland provided informed consent to pursue genetic testing and the blood sample was sent to [facility] for analysis of the CancerNext-Expanded +RNAinsight Panels. Results should be available within approximately 3 weeks' time, at which point they will be disclosed by telephone to Robert Holland, as will any additional recommendations warranted by these results. Robert Holland will receive a summary of his genetic counseling visit and a copy of his results once available. This information will also be available in Epic.   Based on Robert Holland's family history, we recommended his paternal cousin, who was diagnosed with breast cancer in her 63s, have genetic counseling and testing. Robert Holland will let us know if we can be of any assistance in coordinating genetic counseling and/or testing for this family member.   Lastly, we encouraged Robert Holland to remain in contact with cancer genetics annually so that we can continuously update the family history and inform him of any changes in cancer genetics and testing that may be of benefit for this family.   Robert Holland questions were answered to his satisfaction today. Our contact information was provided should additional questions or concerns arise. Thank you for the referral and allowing [place] to share in the care of your patient.   Ferdie Bakken M. Joette Catching, [place],  [facility] Genetic Counselor [email or web address] (P) [phone]   The patient was seen for a total of 25 minutes in face-to-face genetic counseling.  The patient was accompanied by his wife, Stanton Kidney. Drs. Magrinat, Lindi Adie and/or Burr Medico were available to discuss this case as needed.  _______________________________________________________________________ For Office Staff:  Number of people involved in session: 2 Was an Intern/ student involved with case: no

## 2020-11-22 ENCOUNTER — Encounter: Payer: Self-pay | Admitting: Genetic Counselor

## 2020-11-22 ENCOUNTER — Ambulatory Visit: Payer: Self-pay | Admitting: Genetic Counselor

## 2020-11-22 ENCOUNTER — Telehealth: Payer: Self-pay | Admitting: Genetic Counselor

## 2020-11-22 DIAGNOSIS — Z1379 Encounter for other screening for genetic and chromosomal anomalies: Secondary | ICD-10-CM | POA: Insufficient documentation

## 2020-11-22 DIAGNOSIS — Z86010 Personal history of colonic polyps: Secondary | ICD-10-CM

## 2020-11-22 DIAGNOSIS — Z803 Family history of malignant neoplasm of breast: Secondary | ICD-10-CM

## 2020-11-22 DIAGNOSIS — Z0000 Encounter for general adult medical examination without abnormal findings: Secondary | ICD-10-CM | POA: Insufficient documentation

## 2020-11-22 NOTE — Progress Notes (Signed)
HPI:  Robert Holland was previously seen in the [facility] due to a personal history of colon polyps, family history of cancer, and concerns regarding a hereditary predisposition to cancer. Please refer to our prior cancer genetics clinic note for more information regarding our discussion, assessment and recommendations, at the time. Robert Holland recent genetic test results were disclosed to him, as were recommendations warranted by these results. These results and recommendations are discussed in more detail below.  CANCER HISTORY:    Robert Holland is a 55 y.o. male with no personal history of cancer.  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  2019 colonoscopy detected five polyps, and 2022 colonoscopy detected seven polyps.    Oncology History   No history exists.    FAMILY HISTORY:  We obtained a detailed, 4-generation family history.  Significant diagnoses are listed below:      Family History  Problem Relation Age of Onset   Cancer Father 68        brain tumor   Breast cancer Paternal Aunt          dx after 88   Bone cancer Paternal Uncle          dx after 70   Breast cancer Maternal Grandmother          dx 87s   Breast cancer Paternal Grandmother          dx early 25s   Breast cancer Cousin          paternal first cousin; dx 32s-40s    Robert Holland is unaware of previous family history of genetic testing for hereditary cancer risks. There is no reported Ashkenazi Jewish ancestry. There is no known consanguinity.    GENETIC TEST RESULTS: Genetic testing reported out on November 19, 2020. The CancerNext-Expanded +RNAinsight Panel found no pathogenic mutations. The CancerNext-Expanded gene panel offered by [facility] and includes sequencing, rearrangement, and RNA analysis for the following 77 genes: AIP, ALK, APC, ATM, AXIN2, BAP1, BARD1, BLM, BMPR1A, BRCA1, BRCA2, BRIP1, CDC73, CDH1, CDK4, CDKN1B, CDKN2A, CHEK2, CTNNA1, DICER1, FANCC, FH, FLCN, GALNT12,  KIF1B, LZTR1, MAX, MEN1, MET, MLH1, MSH2, MSH3, MSH6, MUTYH, NBN, NF1, NF2, NTHL1, PALB2, PHOX2B, PMS2, POT1, PRKAR1A, PTCH1, PTEN, RAD51C, RAD51D, RB1, RECQL, RET, SDHA, SDHAF2, SDHB, SDHC, SDHD, SMAD4, SMARCA4, SMARCB1, SMARCE1, STK11, SUFU, TMEM127, TP53, TSC1, TSC2, VHL and XRCC2 (sequencing and deletion/duplication); EGFR, EGLN1, HOXB13, KIT, MITF, PDGFRA, POLD1, and POLE (sequencing only); EPCAM and GREM1 (deletion/duplication only).   The test report has been scanned into EPIC and is located under the Molecular Pathology section of the Results Review tab.  A portion of the result report is included below for reference.     We discussed with Robert Holland that because current genetic testing is not perfect, it is possible there may be a gene mutation in one of these genes that current testing cannot detect, but that chance is small.  We also discussed, that there could be another gene that has not yet been discovered, or that we have not yet tested, that is responsible for the cancer diagnoses in the family or personal history of colon polyps. It is also possible there is a hereditary cause for the cancer in the family that Robert Holland did not inherit and therefore was not identified in his testing.  Therefore, it is important to remain in touch with cancer genetics in the future so that we can continue to offer Robert Holland the most  up to date genetic testing.   ADDITIONAL GENETIC TESTING:  We discussed with Robert Holland that his genetic testing was fairly extensive.  If there are genes identified to increase cancer risk that can be analyzed in the future, we would be happy to discuss and coordinate this testing at that time.    CANCER SCREENING RECOMMENDATIONS: Robert Holland test result is considered negative (normal).  This means that we have not identified a hereditary cause for his personal history of polyps at this time.   This does not definitively rule out a hereditary predisposition to cancer. It  is still possible that there could be genetic mutations that are undetectable by current technology. There could be genetic mutations in genes that have not been tested or identified to increase cancer or polyp risk.  Therefore, it is recommended he continue to follow the cancer management and screening guidelines provided by his gastroenterologist and primary healthcare provider.   An individual's cancer risk and medical management are not determined by genetic test results alone. Overall cancer risk assessment incorporates additional factors, including personal medical history, family history, and any available genetic information that may result in a personalized plan for cancer prevention and surveillance  RECOMMENDATIONS FOR FAMILY MEMBERS:  Individuals in this family might be at some increased risk of developing cancer, over the general population risk, simply due to the family history of cancer.  We recommended women in this family have a yearly mammogram beginning at age 48, or 51 years younger than the earliest onset of cancer, an annual clinical breast exam, and perform monthly breast self-exams. Women in this family should also have a gynecological exam as recommended by their primary provider. Family members should inform their providers about the family history of colon polyps so that an appropriate colon screening protocol can be determined for them.   It is also possible there is a hereditary cause for the cancer in Robert Holland's family that he did not inherit and therefore was not identified in him.  Based on Robert Holland's family history, we recommended his paternal aunt and paternal cousin, who were diagnosed with breast cancer, have genetic counseling and testing. Robert Holland will let us know if we can be of any assistance in coordinating genetic counseling and/or testing for this family member.   FOLLOW-UP: Lastly, we discussed with Robert Holland that cancer genetics is a rapidly advancing field  and it is possible that new genetic tests will be appropriate for him and/or his family members in the future. We encouraged him to remain in contact with cancer genetics on an annual basis so we can update his personal and family histories and let him know of advances in cancer genetics that may benefit this family.   Our contact number was provided. Mr. Moan questions were answered to his satisfaction, and he knows he is welcome to call us at anytime with additional questions or concerns.     Yazleen Molock M. Joette Catching, [place], [facility] Genetic Counselor [email or web address] (P) [phone]

## 2020-11-22 NOTE — Telephone Encounter (Signed)
Revealed negative genetic testing.  Discussed that we do not know why he has colon polyps or why there is cancer in the family. Cancer in family could be sporadic/familial, due to a change in a gene he did not inherit, due to a different gene that we are not testing, or maybe our current technology may not be able to pick something up.  It will be important for him to keep in contact with genetics to keep up with whether additional testing may be needed.  Recommended following screening interval recommended by gastroenterologist.

## 2021-05-17 ENCOUNTER — Telehealth: Payer: Self-pay

## 2021-05-17 NOTE — Telephone Encounter (Signed)
Unable to reach pt by phone per DPR left v/m on pts cell; I then was able to speak with pts wife (DPR signed) pts wife at work ; pts wife notified as instructed and pts wife voiced understanding; I offered appt with Dr Diona Browner today at 3:20 and pts wife placed me on hold to contact pt; pt's wife said she could not reach pt but would continue to try. Pt s wife will have pt cb for appt. If cannot reach pt until pts wife gets off at 5pm she will try to convince pt to go to [facility] or ED. Sending note to Dr Einar Pheasant and [facility] CMA. ?

## 2021-05-17 NOTE — Telephone Encounter (Signed)
Per access nurse note pt does have appt already scheduled on 05/20/21 at 9:20 for DM FU. Per access nurse note pt was given pt advice. Sending note to DR Einar Pheasant who is out of office, Gentry Fitz NP who is in office, and [place] CMA. ?

## 2021-05-17 NOTE — Telephone Encounter (Signed)
Agree with Access nurse recommendation for UC or ER or office visit ideally earlier than Monday. Could be infection, kidney stone, or back pathology which would benefit from urgent work-up.  ? ?Please call to see if you can convince patient to go to UC if no appoints at [facility] today ?

## 2021-05-17 NOTE — Telephone Encounter (Signed)
Noted  

## 2021-05-17 NOTE — Telephone Encounter (Signed)
[facility] - Client ?TELEPHONE ADVICE RECORD ?AccessNurse? ?Patient ?Name: ?Robert Holland ?MAN ?Gender: Male ?DOB: [birth year] ?Age: 56 Y 5 M 21 D ?Return ?Phone ?Number: ?[phone] ?(Primary) ?Address: ?City/ ?State/ ?Zip: ?[place] ?Client [place] [facility] - Client ?Client Site [place] - Day ?Provider Waunita Schooner- MD ?Contact Type Call ?Who Is Calling Patient / Member / Family / Caregiver ?Call Type Triage / Clinical ?Relationship To Patient Self ?Return Phone Number [phone] (Primary) ?Chief Complaint Health information question (non symptomatic) ?Reason for Call Symptomatic / Request for Health Information ?Initial Comment Thinks he has kidney stones, wanted to talk to a ?nurse. does have appt on Monday. Office did not ?have pt on the line, unable to verify information ?Translation No ?Nurse Assessment ?Nurse: Roselie Awkward, RN, Heather Date/Time (Eastern Time): 05/17/2021 9:37:37 AM ?Confirm and document reason for call. If ?symptomatic, describe symptoms. ?---Caller states he is having left lower back pain into ?groin that comes and goes. Also having vomiting. ?Denies pain with urination. Symptoms started last ?week ?Does the patient have any new or worsening ?symptoms? ---Yes ?Will a triage be completed? ---Yes ?Related visit to physician within the last 2 weeks? ---No ?Does the PT have any chronic conditions? (i.e. ?diabetes, asthma, this includes High risk factors for ?pregnancy, etc.) ?---Yes ?List chronic conditions. ---DM ?Is this a behavioral health or substance abuse call? ---No ?Guidelines ?Guideline Title Affirmed Question Affirmed Notes Nurse Date/Time (Eastern ?Time) ?Urination Pain - Male Vomiting Amadeo Garnet 05/17/2021 9:39:42 ?AM ?Disp. Time (Eastern ?Time) Disposition Final User ?05/17/2021 9:41:45 AM Go to ED Now (or PCP triage) Yes Roselie Awkward, RN, Heather ?PLEASE NOTE: All timestamps contained within this report are represented  as [place] Standard Time. ?CONFIDENTIALTY NOTICE: This fax transmission is intended only for the addressee. It contains information that is legally privileged, confidential or ?otherwise protected from use or disclosure. If you are not the intended recipient, you are strictly prohibited from reviewing, disclosing, copying using ?or disseminating any of this information or taking any action in reliance on or regarding this information. If you have received this fax in error, please ?notify us immediately by telephone so that we can arrange for its return to [place]. Phone: [phone], Toll-Free: [phone], Fax: [phone] ?Page: 2 of 2 ?Call Id: [id number] ?Caller Disagree/Comply Disagree ?Caller Understands Yes ?PreDisposition Call Doctor ?Care Advice Given Per Guideline ?GO TO ED NOW (OR PCP TRIAGE): * IF NO PCP (PRIMARY CARE PROVIDER) SECOND-LEVEL TRIAGE: You need ?to be seen within the next hour. Go to the [facility] at _____________ [place] as soon as you can. * IF PCP SECONDLEVEL TRIAGE REQUIRED: You may need to be seen. Your doctor (or NP/PA) will want to talk with you to decide what's best. ?I'll page the provider on-call now. If you haven't heard from the provider (or me) within 30 minutes, go directly to the [facility] at ?_____________ [place] given per Urination Pain - Male (Adult) guideline. ?Comments ?User: Mauri Pole, RN Date/Time Eilene Ghazi Time): 05/17/2021 9:39:11 AM ?has appt scheduled Monday ?User: Mauri Pole, RN Date/Time Eilene Ghazi Time): 05/17/2021 9:45:25 AM ?Caller aware of outcome. Verbalizes understanding of instructions and reasoning behind outcome. ?Referrals ?GO TO FACILITY UNDECIDED ?GO TO FACILITY REFUSE ?

## 2021-05-20 ENCOUNTER — Ambulatory Visit: Payer: BC Managed Care – PPO | Admitting: Family Medicine

## 2021-05-20 VITALS — BP 120/80 | HR 89 | Temp 98.2°F | Ht 72.0 in | Wt 279.5 lb

## 2021-05-20 DIAGNOSIS — R109 Unspecified abdominal pain: Secondary | ICD-10-CM | POA: Diagnosis not present

## 2021-05-20 DIAGNOSIS — M545 Low back pain, unspecified: Secondary | ICD-10-CM

## 2021-05-20 DIAGNOSIS — E785 Hyperlipidemia, unspecified: Secondary | ICD-10-CM

## 2021-05-20 DIAGNOSIS — E1169 Type 2 diabetes mellitus with other specified complication: Secondary | ICD-10-CM | POA: Diagnosis not present

## 2021-05-20 DIAGNOSIS — R319 Hematuria, unspecified: Secondary | ICD-10-CM

## 2021-05-20 DIAGNOSIS — R7303 Prediabetes: Secondary | ICD-10-CM | POA: Diagnosis not present

## 2021-05-20 LAB — COMPREHENSIVE METABOLIC PANEL
ALT: 103 U/L — ABNORMAL HIGH (ref 0–53)
AST: 80 U/L — ABNORMAL HIGH (ref 0–37)
Albumin: 4.6 g/dL (ref 3.5–5.2)
Alkaline Phosphatase: 70 U/L (ref 39–117)
BUN: 12 mg/dL (ref 6–23)
CO2: 31 mEq/L (ref 19–32)
Calcium: 9.7 mg/dL (ref 8.4–10.5)
Chloride: 98 mEq/L (ref 96–112)
Creatinine, Ser: 0.9 mg/dL (ref 0.40–1.50)
GFR: 95.5 mL/min (ref >60.00)
Glucose, Bld: 188 mg/dL — ABNORMAL HIGH (ref 70–99)
Potassium: 4.3 mEq/L (ref 3.5–5.1)
Sodium: 137 mEq/L (ref 135–145)
Total Bilirubin: 1.1 mg/dL (ref 0.2–1.2)
Total Protein: 7 g/dL (ref 6.0–8.3)

## 2021-05-20 LAB — LIPID PANEL
Cholesterol: 105 mg/dL (ref 0–200)
HDL: 36.5 mg/dL — ABNORMAL LOW (ref >39.00)
NonHDL: 68.39
Total CHOL/HDL Ratio: 3
Triglycerides: 208 mg/dL — ABNORMAL HIGH (ref 0.0–149.0)
VLDL: 41.6 mg/dL — ABNORMAL HIGH (ref 0.0–40.0)

## 2021-05-20 LAB — POCT GLYCOSYLATED HEMOGLOBIN (HGB A1C): Hemoglobin A1C: 8 % — AB (ref 4.0–5.6)

## 2021-05-20 LAB — POC URINALSYSI DIPSTICK (AUTOMATED)
Bilirubin, UA: NEGATIVE
Blood, UA: POSITIVE
Glucose, UA: NEGATIVE
Leukocytes, UA: NEGATIVE
Nitrite, UA: NEGATIVE
Protein, UA: POSITIVE — AB
Spec Grav, UA: 1.015 (ref 1.010–1.025)
Urobilinogen, UA: 1 E.U./dL
pH, UA: 6 (ref 5.0–8.0)

## 2021-05-20 LAB — LDL CHOLESTEROL, DIRECT: Direct LDL: 46 mg/dL

## 2021-05-20 LAB — CBC
HCT: 44.9 % (ref 39.0–52.0)
Hemoglobin: 15.6 g/dL (ref 13.0–17.0)
MCHC: 34.8 g/dL (ref 30.0–36.0)
MCV: 89.7 fl (ref 78.0–100.0)
Platelets: 172 10*3/uL (ref 150.0–400.0)
RBC: 5 Mil/uL (ref 4.22–5.81)
RDW: 12.8 % (ref 11.5–15.5)
WBC: 5.8 10*3/uL (ref 4.0–10.5)

## 2021-05-20 MED ORDER — HYDROCODONE-ACETAMINOPHEN 5-325 MG PO TABS: 1.0000 | ORAL_TABLET | Freq: Four times a day (QID) | ORAL | 0 refills | Status: DC | PRN

## 2021-05-20 MED ORDER — METFORMIN HCL 500 MG PO TABS: ORAL_TABLET | ORAL | 1 refills | Status: DC

## 2021-05-20 MED ORDER — OZEMPIC (0.25 OR 0.5 MG/DOSE) 2 MG/1.5ML ~~LOC~~ SOPN: 0.2500 mg | PEN_INJECTOR | SUBCUTANEOUS | 1 refills | Status: DC

## 2021-05-20 NOTE — Assessment & Plan Note (Signed)
Colicky back pain with radiation to the groin.  UA with blood today.  Concern for possible stone.  Labs to evaluate kidney function.  Not currently having symptoms, so discussed watch and wait if symptoms return to move forward with CT renal study.  Hydrocodone for severe pain prescribed today.  Prostate exam was normal so low concern for prostatitis.  If CT normal suspect MSK related and advised checking in with back surgeon or physical therapy. ?

## 2021-05-20 NOTE — Assessment & Plan Note (Signed)
Lab Results  ?Component Value Date  ? HGBA1C 8.0 (A) 05/20/2021  ? ?Poorly controlled.  Likely secondary to diet and decreased metformin dose.  We will continue metformin due to diarrhea at higher dose.  Discussed starting Ozempic 0.25 mg weekly to help with weight loss and improve diabetes control.  He will update in 4 weeks for dose increase if tolerating.  Continue to work on diet. ?

## 2021-05-20 NOTE — Patient Instructions (Addendum)
Diabetes ?- continue metformin ?- start ozempic ?- update in 3-4 weeks for dose increase ?- return in 3 months ?- Work on diabetic diet ? ?Back pain ?- possible kidney stone ?- labs today ?- CT Scan ?- pain medication ?- Ortho or PT if work-up negative ?

## 2021-05-20 NOTE — Assessment & Plan Note (Signed)
Continue atorvastatin 10 mg.  Labs today. ?

## 2021-05-20 NOTE — Progress Notes (Signed)
? ?Subjective:  ? ?  ?Robert Holland is a 56 y.o. male presenting for Follow-up (DM ) and Back Pain (Lower, into L flank and groin x 10 days. ) ?  ? ? ?HPI ? ?#Diabetes ?Currently taking metformin 1000 mg AM and 500 mg PM  ?Using medications without difficulties: Yes ?Hypoglycemic episodes:No  ?Hyperglycemic episodes:No  ?Feet problems:No  ?Blood Sugars averaging: does not check ?Last HgbA1c:  ?Lab Results  ?Component Value Date  ? HGBA1C 8.0 (A) 05/20/2021  ? ?Not doing well with diet ? ?Diabetes Health Maintenance Due:  ?  ?Diabetes Health Maintenance Due  ?Topic Date Due  ? OPHTHALMOLOGY EXAM  Never done  ? URINE MICROALBUMIN  06/25/2021  ? FOOT EXAM  08/06/2021  ? HEMOGLOBIN A1C  11/19/2021  ? ?#Back pain ?- left low back pain ?- radiates to the groin ?- symptoms started 10 days ago ?- on and off ?- chills ?- no fever ?- nausea and occasional vomiting ?- no abdominal pain ?- no dysuria, no frequency, no urgency ?- the pain will come on and last for 3-4 hours ?- eventually pain will resolve ?- no change with BM/urine ?- stools are soft, easy to pass  ?- no diarrhea or constipation ?- no leg pain ?- hx of kidney stones >20 years ago ?- last pain episode was Saturday AM ?- occurring every few days ?- taking ibuprofen w/o improvement ?- does not feel the pain is spasm like ? ?Review of Systems ? ? ?Social History  ? ?Tobacco Use  ?Smoking Status Never  ?Smokeless Tobacco Current  ? Types: Snuff  ? ? ? ?   ?Objective:  ?  ?BP Readings from Last 3 Encounters:  ?05/20/21 120/80  ?10/17/20 119/77  ?10/08/20 110/76  ? ?Wt Readings from Last 3 Encounters:  ?05/20/21 279 lb 8 oz (126.8 kg)  ?10/17/20 270 lb (122.5 kg)  ?10/08/20 274 lb (124.3 kg)  ? ? ?BP 120/80   Pulse 89   Temp 98.2 ?F (36.8 ?C) (Oral)   Ht 6' (1.829 m)   Wt 279 lb 8 oz (126.8 kg)   SpO2 96%   BMI 37.91 kg/m?  ? ? ?Physical Exam ?Exam conducted with a chaperone present.  ?Constitutional:   ?   Appearance: Normal appearance. He is not ill-appearing or  diaphoretic.  ?HENT:  ?   Right Ear: External ear normal.  ?   Left Ear: External ear normal.  ?   Nose: Nose normal.  ?Eyes:  ?   General: No scleral icterus. ?   Extraocular Movements: Extraocular movements intact.  ?   Conjunctiva/sclera: Conjunctivae normal.  ?Cardiovascular:  ?   Rate and Rhythm: Normal rate and regular rhythm.  ?   Heart sounds: No murmur heard. ?Pulmonary:  ?   Effort: Pulmonary effort is normal. No respiratory distress.  ?   Breath sounds: Normal breath sounds. No wheezing.  ?Abdominal:  ?   General: Abdomen is flat. Bowel sounds are normal. There is no distension.  ?   Palpations: Abdomen is soft.  ?   Tenderness: There is no abdominal tenderness. There is no guarding or rebound.  ?Genitourinary: ?   Prostate: Not enlarged and not tender.  ?Musculoskeletal:  ?   Cervical back: Neck supple.  ?   Comments: Back ?Inspection: no abnormality ?Palpation: no spinous or paraspinous ttp ?ROM: normal ?Strength: normal ?Straight leg raise negative ? ?Hip ?No pain with hip rotation  ?Skin: ?   General: Skin is warm and dry.  ?  Neurological:  ?   Mental Status: He is alert. Mental status is at baseline.  ?Psychiatric:     ?   Mood and Affect: Mood normal.  ? ? ?UA: +RBC, + protein, neg LE, neg nitrites ? ? ?   ?Assessment & Plan:  ? ?Problem List Items Addressed This Visit   ? ?  ? Endocrine  ? Type 2 diabetes mellitus with other specified complication ([place]) - Primary  ?  Lab Results  ?Component Value Date  ? HGBA1C 8.0 (A) 05/20/2021  ?Poorly controlled.  Likely secondary to diet and decreased metformin dose.  We will continue metformin due to diarrhea at higher dose.  Discussed starting Ozempic 0.25 mg weekly to help with weight loss and improve diabetes control.  He will update in 4 weeks for dose increase if tolerating.  Continue to work on diet. ?  ?  ? Relevant Medications  ? metFORMIN (GLUCOPHAGE) 500 MG tablet  ? Semaglutide,0.25 or 0.'5MG'$ /DOS, (OZEMPIC, 0.25 OR 0.5 MG/DOSE,) 2 MG/1.5ML SOPN  ?  Other Relevant Orders  ? POCT glycosylated hemoglobin (Hb A1C) (Completed)  ?  ? Other  ? Hyperlipidemia  ?  Continue atorvastatin 10 mg.  Labs today. ?  ?  ? Relevant Orders  ? Lipid panel  ? Acute left-sided low back pain  ?  Colicky back pain with radiation to the groin.  UA with blood today.  Concern for possible stone.  Labs to evaluate kidney function.  Not currently having symptoms, so discussed watch and wait if symptoms return to move forward with CT renal study.  Hydrocodone for severe pain prescribed today.  Prostate exam was normal so low concern for prostatitis.  If CT normal suspect MSK related and advised checking in with back surgeon or physical therapy. ?  ?  ? Relevant Medications  ? HYDROcodone-acetaminophen (NORCO/VICODIN) 5-325 MG tablet  ? Other Relevant Orders  ? POCT Urinalysis Dipstick (Automated) (Completed)  ? CT RENAL STONE STUDY  ? Comprehensive metabolic panel  ? CBC  ? ?Other Visit Diagnoses   ? ? Prediabetes      ? Abdominal pain, unspecified abdominal location      ? Relevant Medications  ? HYDROcodone-acetaminophen (NORCO/VICODIN) 5-325 MG tablet  ? Other Relevant Orders  ? CT RENAL STONE STUDY  ? CBC  ? Hematuria, unspecified type      ? Relevant Medications  ? HYDROcodone-acetaminophen (NORCO/VICODIN) 5-325 MG tablet  ? Other Relevant Orders  ? CT RENAL STONE STUDY  ? Comprehensive metabolic panel  ? CBC  ? ?  ? ? ? ?Return in about 3 months (around 08/19/2021) for diabetes. ? ?Lesleigh Noe, MD ? ?This visit occurred during the SARS-CoV-2 public health emergency.  Safety protocols were in place, including screening questions prior to the visit, additional usage of staff PPE, and extensive cleaning of exam room while observing appropriate contact time as indicated for disinfecting solutions.  ? ?

## 2021-05-23 ENCOUNTER — Telehealth: Payer: Self-pay

## 2021-05-23 NOTE — Telephone Encounter (Signed)
CT changed to STAT. Pt asked if he would be willing to go anywhere for the CT and he said yes. Pt scheduled At 8:30 at [facility].  ?

## 2021-05-23 NOTE — Telephone Encounter (Signed)
Patient called to follow up on CT appointment. I have sent a message to referral to follow up I that. He is concerned that he only has 3 of the Norco left. Wanted to know if he can get refill until he is able to get imaging done.  ?Norco 5-325 #10 received on 05/20/21 ?

## 2021-05-23 NOTE — Addendum Note (Signed)
Addended by: Loreen Freud on: 05/23/2021 03:23 PM ? ? Modules accepted: Orders ? ?

## 2021-05-23 NOTE — Telephone Encounter (Signed)
Can his imaging be changed to STAT?  ? ?When I saw him Monday he was not currently having pain so did not make stat. If pain is persisting and he is out of meds then we need to get imaging first.

## 2021-05-23 NOTE — Telephone Encounter (Signed)
Patient called to follow up on CT appointment had not received call from them to schedule. I have no idea how to check for documentation on where you are with this process. Do not know how to see where order is for so not able to give patient phone number to cal himself.  ?

## 2021-05-24 ENCOUNTER — Ambulatory Visit (HOSPITAL_BASED_OUTPATIENT_CLINIC_OR_DEPARTMENT_OTHER)
Admission: RE | Admit: 2021-05-24 | Discharge: 2021-05-24 | Disposition: A | Discharge status: Home / Self Care | Payer: BC Managed Care – PPO | Source: Ambulatory Visit | Attending: Family Medicine | Admitting: Family Medicine

## 2021-05-24 ENCOUNTER — Encounter: Payer: Self-pay | Admitting: Family Medicine

## 2021-05-24 ENCOUNTER — Other Ambulatory Visit: Payer: Self-pay | Admitting: Family Medicine

## 2021-05-24 DIAGNOSIS — M545 Low back pain, unspecified: Secondary | ICD-10-CM

## 2021-05-24 DIAGNOSIS — K7460 Unspecified cirrhosis of liver: Secondary | ICD-10-CM | POA: Insufficient documentation

## 2021-05-24 DIAGNOSIS — N200 Calculus of kidney: Secondary | ICD-10-CM

## 2021-05-24 DIAGNOSIS — R109 Unspecified abdominal pain: Secondary | ICD-10-CM | POA: Diagnosis present

## 2021-05-24 DIAGNOSIS — R319 Hematuria, unspecified: Secondary | ICD-10-CM

## 2021-05-24 DIAGNOSIS — R161 Splenomegaly, not elsewhere classified: Secondary | ICD-10-CM

## 2021-05-24 MED ORDER — TAMSULOSIN HCL 0.4 MG PO CAPS: 0.4000 mg | ORAL_CAPSULE | Freq: Every day | ORAL | 0 refills | Status: DC

## 2021-05-24 MED ORDER — HYDROCODONE-ACETAMINOPHEN 5-325 MG PO TABS: 1.0000 | ORAL_TABLET | Freq: Four times a day (QID) | ORAL | 0 refills | Status: DC | PRN

## 2021-06-14 ENCOUNTER — Telehealth: Payer: Self-pay

## 2021-06-14 NOTE — Telephone Encounter (Signed)
Pt's wife called checking on referral to GI Specialist. There is a note in the referral from 4-26 "Dr. Tarri Glenn recommended the patient be sent to see [facility] for Hepatology". Please advise soon as they have been waiting for several weeks.  ?

## 2021-06-18 NOTE — Telephone Encounter (Signed)
Noted and appreciate the help! ?

## 2021-06-18 NOTE — Telephone Encounter (Signed)
Spoke to pt and notified him of the [employer] sent from [place] with the info needed. Pt will reply via mychart or call us back.  ?

## 2021-06-18 NOTE — Telephone Encounter (Signed)
I missed that note ? ?I will re-process the referral ?

## 2021-06-18 NOTE — Telephone Encounter (Signed)
Pt's wife called again asking about an update on a referral. Please advise soon as they have been waiting for several weeks.Thank you ? ?Callback Number: [phone] ?

## 2021-06-18 NOTE — Telephone Encounter (Signed)
I have sent this over to [facility] to see if able to work him in with Dr Tarri Glenn .. they are having her review this and will get back with [place] or call the patient to schedule. This was sent to them to review 06/06/21.  ? ?I will send back to [facility] to follow up.  ? ?------------------------------------ ?Jamelle Haring,  ? ?Is Dr Tarri Glenn able to see this patient. I sent over a request 06/06/21 and was advised that this was under review.  ? ?Thanks! ?-Varney Daily, CMA ?Referral Coordinator ? ?

## 2021-06-18 NOTE — Telephone Encounter (Signed)
Pt CT scan done 05/24/21 ?

## 2021-06-18 NOTE — Telephone Encounter (Signed)
Mychart sent to patient with locations/options for referral.  ? ?[facility] versus [facility].  ? ? ?

## 2021-06-26 ENCOUNTER — Other Ambulatory Visit: Payer: Self-pay | Admitting: Family Medicine

## 2021-06-26 DIAGNOSIS — N200 Calculus of kidney: Secondary | ICD-10-CM

## 2021-07-03 ENCOUNTER — Telehealth: Payer: Self-pay | Admitting: Family Medicine

## 2021-07-03 NOTE — Telephone Encounter (Signed)
Robert Holland (Spouse) called asking about the referral status to [facility]. She stated "It has been 6 weeks and we called recently to [facility] about this, they stated they have not received the fax at all." She wants to speak with "someone before 5 pm tomorrow so she can figure this out." Please return the call when possible. ? ?Callback Number: [phone] ? ?

## 2021-07-05 NOTE — Telephone Encounter (Signed)
Pt is calling again asking about his referral status to [facility]. He is just wondering what is going on. Please advise and return the call when possible. Pt's wife called also on 07/03/2021 about this same thing.  Callback Number: [phone]

## 2021-07-08 NOTE — Telephone Encounter (Signed)
This referral was sent 06/26/21 to [facility] - pt was notified same day and advised to call to schedule.   Referral Notes . Type Date User Summary Attachment  General 06/26/2021  1:04 PM Virl Cagey, CMA - -  Note:   Patient requests [facility] Located in: [facility] Address: [facility] [place], [place] [address] Phone: [phone]   Referral and OV notes faxed via [facility]. Faxed to [phone]     [email or web address]   Virl Cagey, CMA on 06/26/2021 1303 - delivered at 06/26/2021 1303

## 2021-07-09 NOTE — Telephone Encounter (Signed)
Patient's wife attempted to schedule her husband's appointment with the liver doctor  Called [phone] and spoke to GI nurse  She told us to fax the updated referral to [phone]  The office number for Hepatology is [phone], this information was shared with the patient  Patient will follow-up with Hepatology tomorrow  Faxed to [phone]         [email or web address]     Wynell Balloon on 07/09/2021 1615 - delivered at 07/09/2021 1615

## 2021-07-18 NOTE — Telephone Encounter (Signed)
Spoke to [facility], she received the fax that was sent over on 5.31.23  Schedulers from [place] will call the patient this afternoon to get him scheduled with someone from Hepatology

## 2021-07-19 ENCOUNTER — Telehealth: Payer: Self-pay | Admitting: Family Medicine

## 2021-07-19 NOTE — Telephone Encounter (Signed)
I called to follow up with the patient. He stated that [facility] did call him yesterday after 5. He will be calling them today to set up an appointment. He appreciated the follow up.

## 2021-09-12 ENCOUNTER — Other Ambulatory Visit: Payer: Self-pay | Admitting: Family Medicine

## 2021-09-12 DIAGNOSIS — E785 Hyperlipidemia, unspecified: Secondary | ICD-10-CM

## 2021-09-25 ENCOUNTER — Encounter (INDEPENDENT_AMBULATORY_CARE_PROVIDER_SITE_OTHER): Payer: Self-pay

## 2021-09-27 ENCOUNTER — Telehealth: Payer: Self-pay | Admitting: Family Medicine

## 2021-09-27 NOTE — Telephone Encounter (Signed)
Pt scheduled to see Dr. Einar Pheasant on Monday 09/30/21. Will pend refill for then.

## 2021-09-27 NOTE — Telephone Encounter (Signed)
  Encourage patient to contact the pharmacy for refills or they can request refills through [facility]  Did the patient contact the pharmacy: No  LAST APPOINTMENT DATE: 05/20/21  NEXT APPOINTMENT DATE: 09/30/21  MEDICATION: Semaglutide,0.25 or 0.'5MG'$ /DOS, (OZEMPIC, 0.25 OR 0.5 MG/DOSE,) 2 MG/1.5ML SOPN  Is the patient out of medication? Yes  PHARMACY: [facility], [place]  Let patient know to contact pharmacy at the end of the day to make sure medication is ready.  Please notify patient to allow 48-72 hours to process

## 2021-09-30 ENCOUNTER — Ambulatory Visit: Payer: BC Managed Care – PPO | Admitting: Family Medicine

## 2021-09-30 DIAGNOSIS — E1169 Type 2 diabetes mellitus with other specified complication: Secondary | ICD-10-CM

## 2021-09-30 LAB — POCT GLYCOSYLATED HEMOGLOBIN (HGB A1C): Hemoglobin A1C: 9.3 % — AB (ref 4.0–5.6)

## 2021-09-30 MED ORDER — OZEMPIC (0.25 OR 0.5 MG/DOSE) 2 MG/1.5ML ~~LOC~~ SOPN: 0.5000 mg | PEN_INJECTOR | SUBCUTANEOUS | 0 refills | Status: DC

## 2021-09-30 MED ORDER — METFORMIN HCL ER 500 MG PO TB24: 500.0000 mg | ORAL_TABLET | Freq: Every day | ORAL | 0 refills | Status: DC

## 2021-09-30 MED ORDER — EMPAGLIFLOZIN 10 MG PO TABS: 10.0000 mg | ORAL_TABLET | Freq: Every day | ORAL | 0 refills | Status: DC

## 2021-09-30 NOTE — Assessment & Plan Note (Signed)
Lab Results  Component Value Date   HGBA1C 9.3 (A) 09/30/2021   Orally controlled, and difficulty tolerating metformin.  Was doing well on 0.25 mg of Ozempic, but never updated for dose increase and ran out.  Refill of Ozempic at 0.5 mg.  He will update in 3 weeks for dose increase.  Metformin to long-acting to see if he will tolerate 500 mg daily.  Start Jardiance for improved glucose control.  Hopefully be able to taper off of Jardiance if patient loses weight and tolerates maximum dose of Ozempic.

## 2021-09-30 NOTE — Progress Notes (Signed)
Subjective:     Robert Holland is a 56 y.o. male presenting for Follow-up (DM )     HPI  #Diabetes Currently taking metformin (Glucophage, Riomet) and ozempic Using medications without difficulties: No - but significant diarrhea with Hypoglycemic episodes: does not check Hyperglycemic episodes: does not check Feet problems:No  Blood Sugars averaging: does not check Last HgbA1c:  Lab Results  Component Value Date   HGBA1C 9.3 (A) 09/30/2021    Diabetes Health Maintenance Due:    Diabetes Health Maintenance Due  Topic Date Due   OPHTHALMOLOGY EXAM  Never done   URINE MICROALBUMIN  06/25/2021   HEMOGLOBIN A1C  04/02/2022   FOOT EXAM  10/01/2022   Exercise - more active job, self defense training Diet - pretty good  Did see some weight loss with ozempic and returning work    Review of Systems   Social History   Tobacco Use  Smoking Status Never  Smokeless Tobacco Current   Types: Snuff        Objective:    BP Readings from Last 3 Encounters:  09/30/21 138/82  05/20/21 120/80  10/17/20 119/77   Wt Readings from Last 3 Encounters:  09/30/21 268 lb 4 oz (121.7 kg)  05/20/21 279 lb 8 oz (126.8 kg)  10/17/20 270 lb (122.5 kg)    BP 138/82   Pulse 97   Temp (!) 96.2 F (35.7 C) (Temporal)   Ht 6' (1.829 m)   Wt 268 lb 4 oz (121.7 kg)   SpO2 97%   BMI 36.38 kg/m    Physical Exam Constitutional:      Appearance: Normal appearance. He is not ill-appearing or diaphoretic.  HENT:     Right Ear: External ear normal.     Left Ear: External ear normal.     Nose: Nose normal.  Eyes:     General: No scleral icterus.    Extraocular Movements: Extraocular movements intact.     Conjunctiva/sclera: Conjunctivae normal.  Cardiovascular:     Rate and Rhythm: Normal rate and regular rhythm.  Pulmonary:     Effort: Pulmonary effort is normal. No respiratory distress.     Breath sounds: Normal breath sounds. No wheezing.  Musculoskeletal:     Cervical  back: Neck supple.  Skin:    General: Skin is warm and dry.  Neurological:     Mental Status: He is alert. Mental status is at baseline.  Psychiatric:        Mood and Affect: Mood normal.     Diabetic Foot Exam - Simple   Simple Foot Form Diabetic Foot exam was performed with the following findings: Yes 09/30/2021  2:30 PM  Visual Inspection No deformities, no ulcerations, no other skin breakdown bilaterally: Yes Sensation Testing Intact to touch and monofilament testing bilaterally: Yes Pulse Check Posterior Tibialis and Dorsalis pulse intact bilaterally: Yes Comments          Assessment & Plan:   Problem List Items Addressed This Visit       Endocrine   Type 2 diabetes mellitus with other specified complication ([place])    Lab Results  Component Value Date   HGBA1C 9.3 (A) 09/30/2021  Orally controlled, and difficulty tolerating metformin.  Was doing well on 0.25 mg of Ozempic, but never updated for dose increase and ran out.  Refill of Ozempic at 0.5 mg.  He will update in 3 weeks for dose increase.  Metformin to long-acting to see if he will tolerate 500 mg  daily.  Start Jardiance for improved glucose control.  Hopefully be able to taper off of Jardiance if patient loses weight and tolerates maximum dose of Ozempic.      Relevant Medications   Semaglutide,0.25 or 0.'5MG'$ /DOS, (OZEMPIC, 0.25 OR 0.5 MG/DOSE,) 2 MG/1.5ML SOPN   metFORMIN (GLUCOPHAGE-XR) 500 MG 24 hr tablet   empagliflozin (JARDIANCE) 10 MG TABS tablet   Other Relevant Orders   POCT glycosylated hemoglobin (Hb A1C) (Completed)     Return in about 3 months (around 12/31/2021) for with TOC and DM f/u with Romilda Garret.  Lesleigh Noe, MD

## 2021-09-30 NOTE — Patient Instructions (Addendum)
Diabetes - Ozempic 0.5 mg weekly - update in 3 weeks with how you are tolerating - will plan to increase to 1 mg if tolerating   - Start Jardiance 10 mg - Try Metformin Long acting 500 mg daily --- if tolerating can increase to 1000 mg after 2 weeks

## 2021-10-03 IMAGING — US US ABDOMEN LIMITED
1 series · 14 of 25 positions shown · non-contrast
Comparison: None.

CLINICAL DATA: Elevated LFTs

EXAM:
ULTRASOUND ABDOMEN LIMITED RIGHT UPPER QUADRANT

[Series 1: us abdomen limited · 0.33mm/px · 14 of 38 slices shown]
[im 1/38]
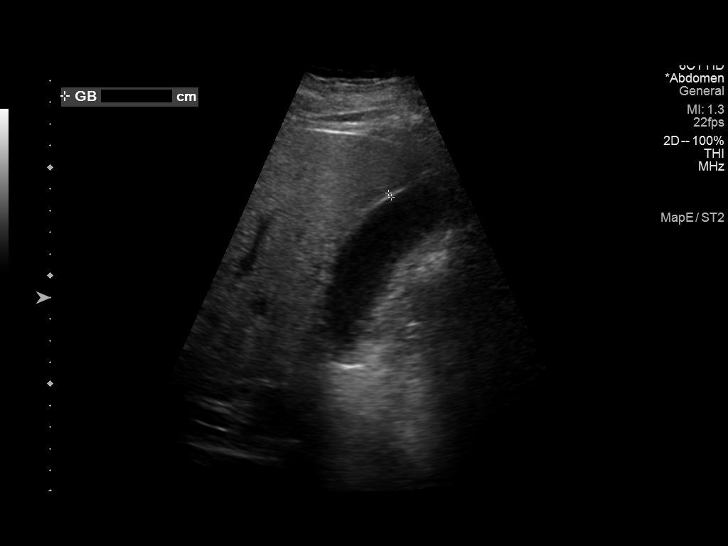
[im 4/38]
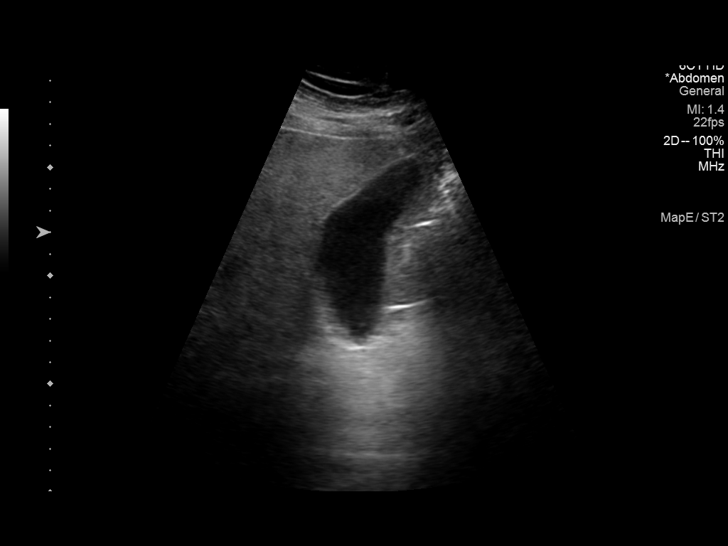
[im 7/38]
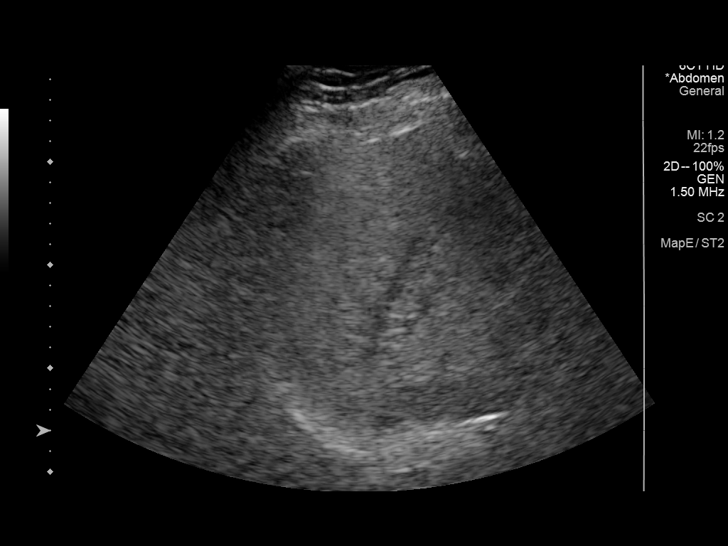
[im 10/38]
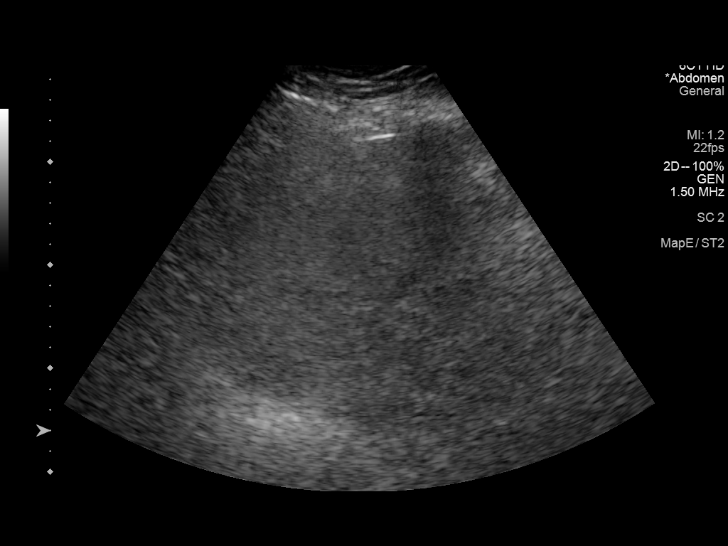
[im 13/38]
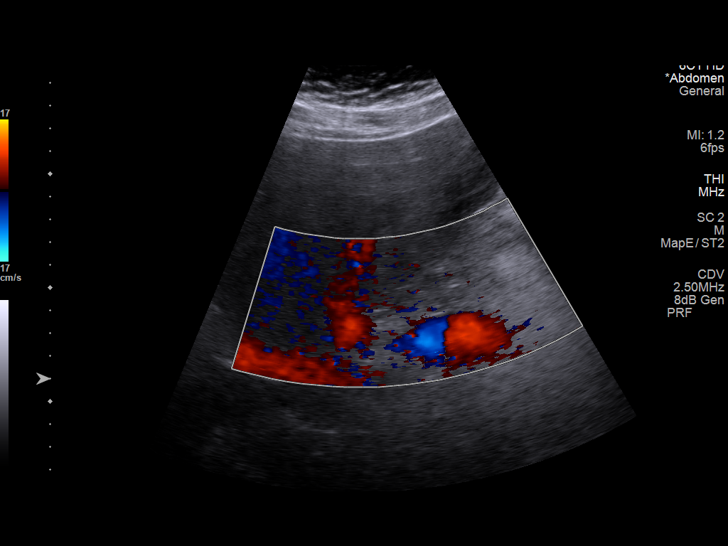
[im 14/38]
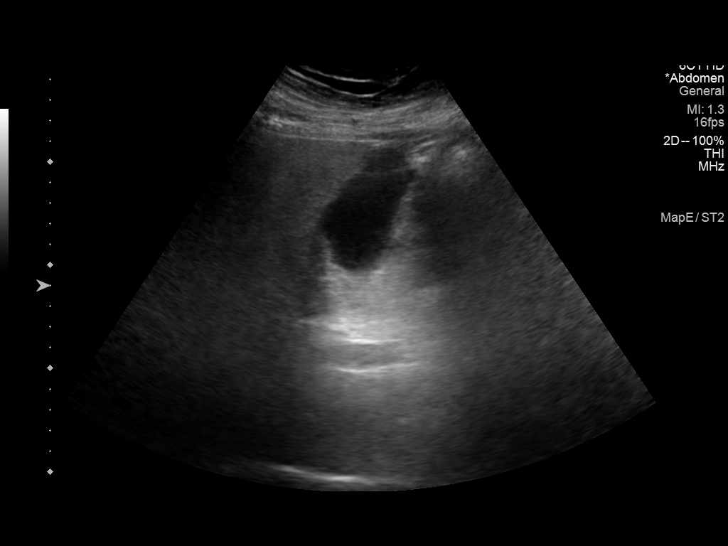
[im 17/38]
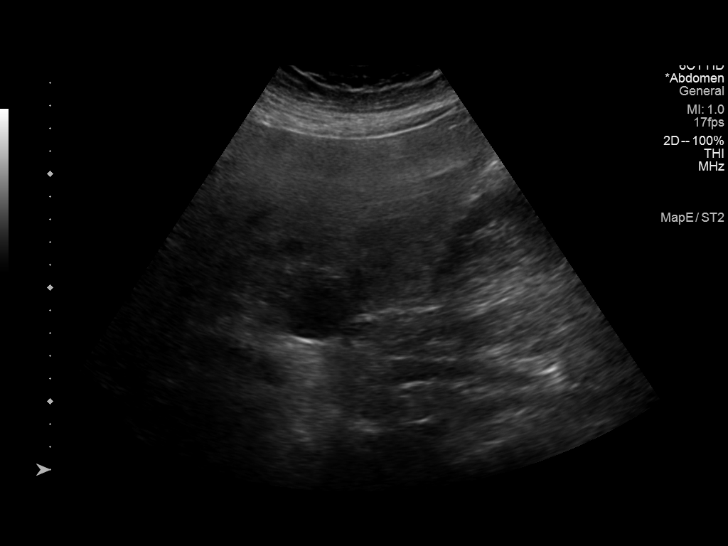
[im 21/38]
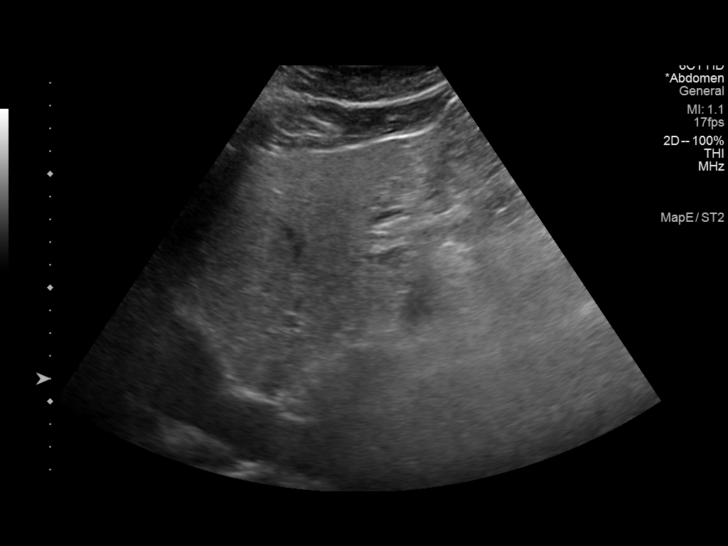
[im 24/38]
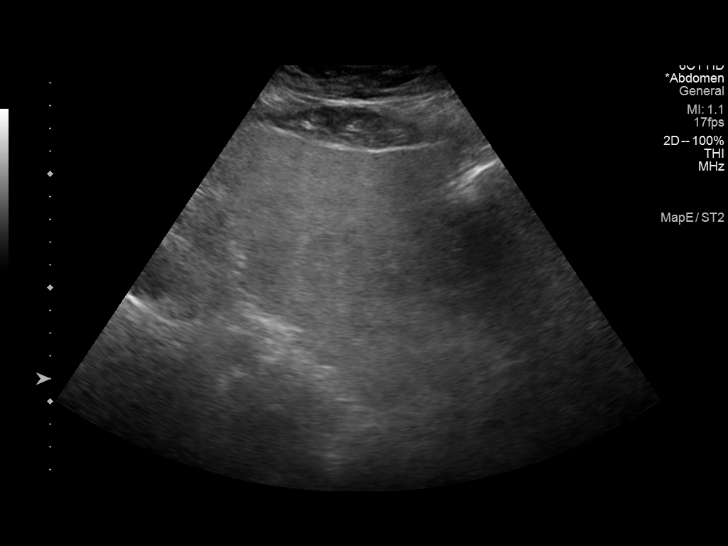
[im 25/38]
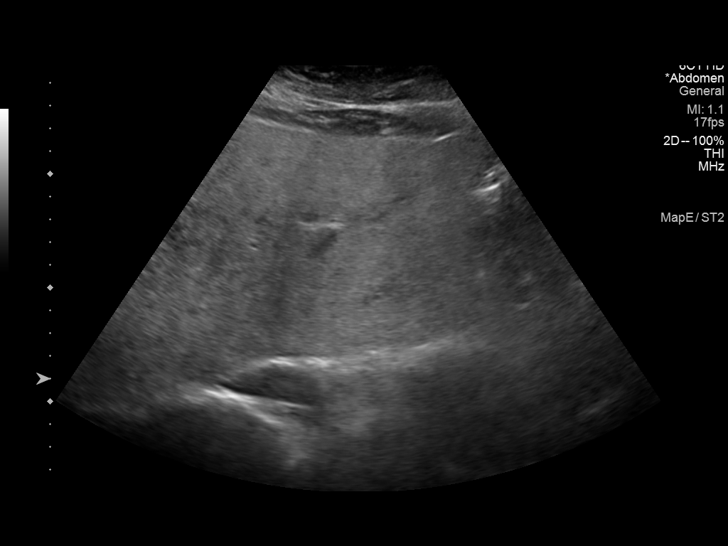
[im 28/38]
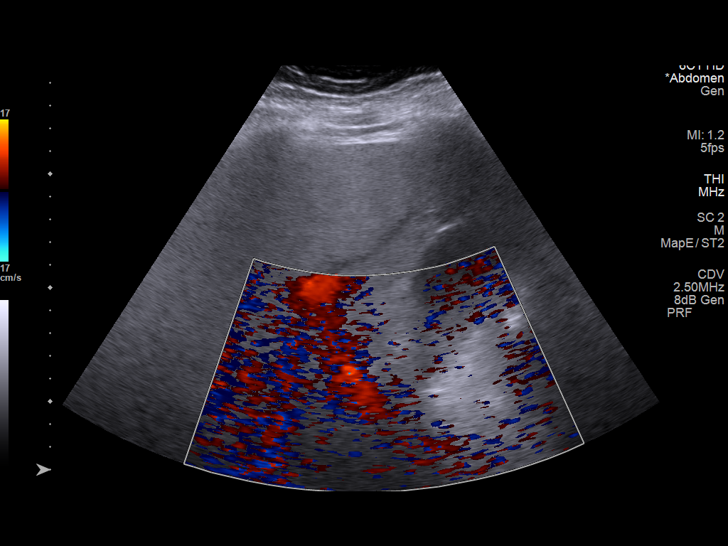
[im 31/38]
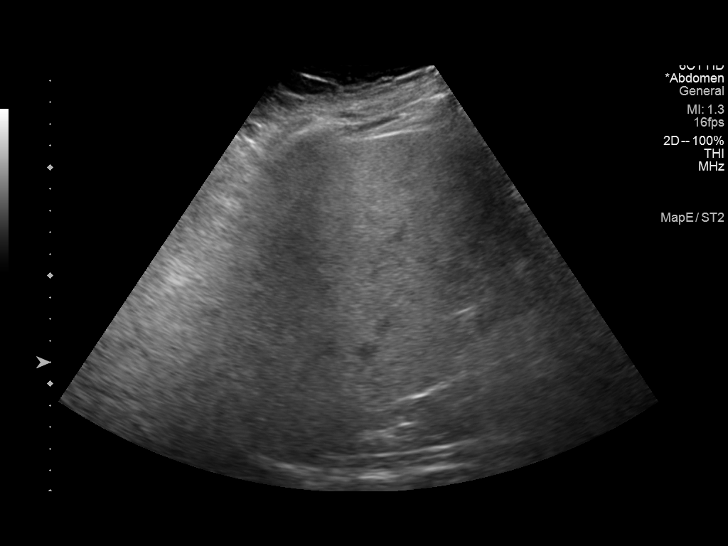
[im 34/38]
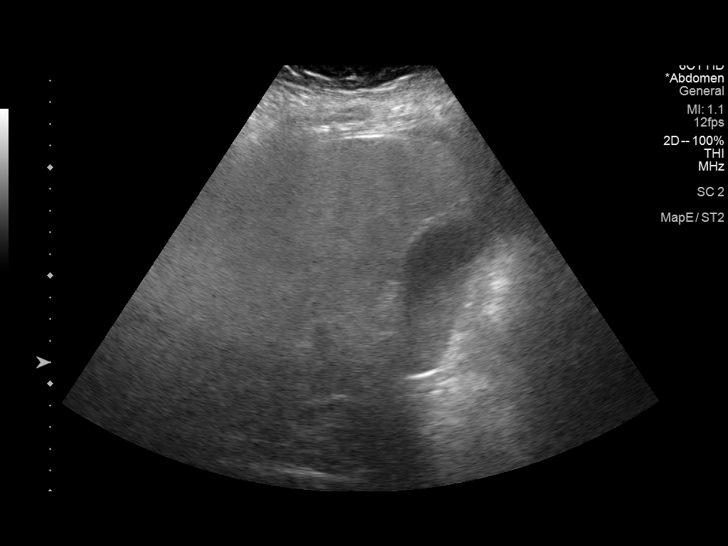
[im 38/38]
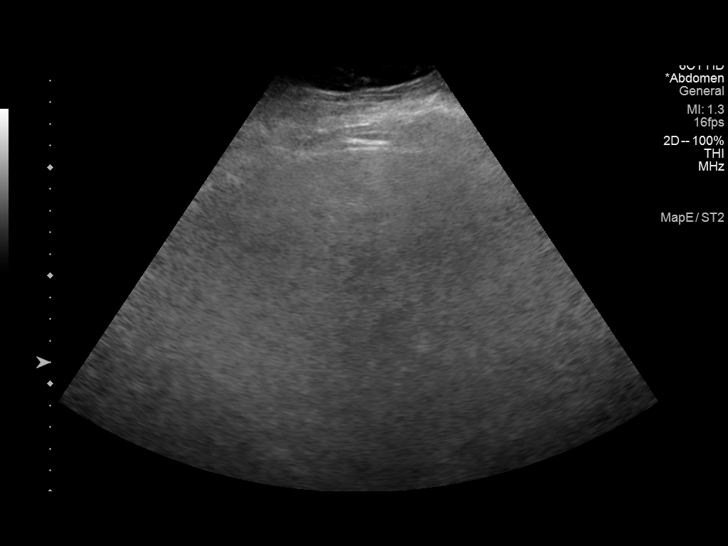

[14 of 25 positions shown; findings below may reference images not displayed]

FINDINGS: Gallbladder:

No gallstones or wall thickening visualized. No sonographic Murphy
sign noted by sonographer.

Common bile duct:

Diameter: 0.3 cm, within normal limits

Liver:

Liver parenchymal echogenicity is diffusely increased. Limited
penetration of the liver due to the increased echogenicity. No
definite focal liver lesion identified. Portal vein is patent on
color Doppler imaging with normal direction of blood flow towards
the liver.

Other: None.
IMPRESSION: Diffusely increased liver parenchymal echogenicity which is
nonspecific but most commonly seen with hepatic steatosis.
Evaluation of the liver was limited due to increased echogenicity
and shadowing bowel gas but no definite focal lesion identified.

## 2021-10-18 ENCOUNTER — Encounter: Payer: Self-pay | Admitting: Family Medicine

## 2021-10-18 DIAGNOSIS — E1169 Type 2 diabetes mellitus with other specified complication: Secondary | ICD-10-CM

## 2021-10-18 MED ORDER — SEMAGLUTIDE (1 MG/DOSE) 4 MG/3ML ~~LOC~~ SOPN: 1.0000 mg | PEN_INJECTOR | SUBCUTANEOUS | 0 refills | Status: DC

## 2021-10-18 NOTE — Addendum Note (Signed)
Addended by: Lesleigh Noe on: 10/18/2021 09:53 AM   Modules accepted: Orders

## 2021-11-15 MED ORDER — SEMAGLUTIDE (2 MG/DOSE) 8 MG/3ML ~~LOC~~ SOPN: 2.0000 mg | PEN_INJECTOR | SUBCUTANEOUS | 1 refills | Status: DC

## 2021-11-15 NOTE — Addendum Note (Signed)
Addended by: Lesleigh Noe on: 11/15/2021 02:13 PM   Modules accepted: Orders

## 2021-12-03 ENCOUNTER — Other Ambulatory Visit: Payer: Self-pay

## 2021-12-03 MED ORDER — EMPAGLIFLOZIN 10 MG PO TABS: 10.0000 mg | ORAL_TABLET | Freq: Every day | ORAL | 0 refills | Status: DC

## 2022-01-01 ENCOUNTER — Ambulatory Visit: Payer: BC Managed Care – PPO | Admitting: Nurse Practitioner

## 2022-01-01 ENCOUNTER — Encounter: Payer: Self-pay | Admitting: Nurse Practitioner

## 2022-01-01 VITALS — BP 124/82 | HR 89 | Temp 97.1°F | Ht 72.0 in | Wt 261.1 lb

## 2022-01-01 DIAGNOSIS — E785 Hyperlipidemia, unspecified: Secondary | ICD-10-CM

## 2022-01-01 DIAGNOSIS — K7469 Other cirrhosis of liver: Secondary | ICD-10-CM

## 2022-01-01 DIAGNOSIS — E1169 Type 2 diabetes mellitus with other specified complication: Secondary | ICD-10-CM

## 2022-01-01 DIAGNOSIS — Z23 Encounter for immunization: Secondary | ICD-10-CM

## 2022-01-01 DIAGNOSIS — Z125 Encounter for screening for malignant neoplasm of prostate: Secondary | ICD-10-CM

## 2022-01-01 LAB — POCT GLYCOSYLATED HEMOGLOBIN (HGB A1C): Hemoglobin A1C: 6.2 % — AB (ref 4.0–5.6)

## 2022-01-01 LAB — HM DIABETES EYE EXAM

## 2022-01-01 MED ORDER — METFORMIN HCL ER 500 MG PO TB24: 500.0000 mg | ORAL_TABLET | Freq: Every day | ORAL | 1 refills | Status: DC

## 2022-01-01 NOTE — Assessment & Plan Note (Signed)
Patient has had an appreciable drop in blood glucose.  Today's A1c is 6.2%.  Tolerating medications well.  Will back off of Jardiance since patient is on full dose of Ozempic 2 mg weekly.  Been on medication approximately 3 weeks so anticipate continued drop with weight loss.

## 2022-01-01 NOTE — Assessment & Plan Note (Signed)
Currently followed by [facility].  Last visit seems to be in August 2023.  Continue following with them as recommended

## 2022-01-01 NOTE — Patient Instructions (Signed)
Nice to see you today I will be in touch with the lab results once I have them  Follow up with me in 3 months for a diabetes recheck. We are going to STOP using the [place]

## 2022-01-01 NOTE — Assessment & Plan Note (Signed)
Currently maintained on atorvastatin 10 mg.

## 2022-01-01 NOTE — Progress Notes (Signed)
Established Patient Office Visit  Subjective   Patient ID: Robert Holland, male    DOB: 12-Nov-1965  Age: 56 y.o. MRN: [id number]  Chief Complaint  Patient presents with   Transfer of Care   Diabetes    Follow up      DM2: States that he does not check sugars at home. '2mg'$  metformin and jardiance.  Diet: 2 meals a day. States that few snacks. States his spouse likes sweets. 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  Cirrhosis: States that he is followed by [facility] liver clinic.  Per patient report it was caused by fatty liver  HLD: See above for exercise and diet.  Patient currently maintained on atorvastatin 10 mg daily.  Colonoscopy: 10/17/2020 Dr. Marius Ditch. Recall in 3 years 2025  Eye: Last exam was done Monday Monday 12/30/2021.  PSA: Last performed in August 2022.  Update today  Tdap: 2019, due in 2029 Flu: up date today Covid: [employer] x2 no boosters. Shingles: update today     Review of Systems  Constitutional:  Negative for chills and fever.  Respiratory:  Negative for shortness of breath.   Cardiovascular:  Negative for chest pain.  Gastrointestinal:  Negative for abdominal pain, constipation, diarrhea, nausea and vomiting.       BM daily   Genitourinary:  Negative for dysuria and hematuria.  Neurological:  Negative for tingling and headaches.  Psychiatric/Behavioral:  Negative for hallucinations and suicidal ideas.       Objective:     BP 124/82   Pulse 89   Temp (!) 97.1 F (36.2 C)   Ht 6' (1.829 m)   Wt 261 lb 2 oz (118.4 kg)   SpO2 98%   BMI 35.41 kg/m  BP Readings from Last 3 Encounters:  01/01/22 124/82  09/30/21 138/82  05/20/21 120/80   Wt Readings from Last 3 Encounters:  01/01/22 261 lb 2 oz (118.4 kg)  09/30/21 268 lb 4 oz (121.7 kg)  05/20/21 279 lb 8 oz (126.8 kg)      Physical Exam Vitals and nursing note reviewed.  Constitutional:       Appearance: Normal appearance. He is obese.  HENT:     Right Ear: Tympanic membrane, ear canal and external ear normal.     Left Ear: Tympanic membrane, ear canal and external ear normal.     Mouth/Throat:     Mouth: Mucous membranes are moist.     Pharynx: Oropharynx is clear.  Eyes:     Extraocular Movements: Extraocular movements intact.     Pupils: Pupils are equal, round, and reactive to light.  Cardiovascular:     Rate and Rhythm: Normal rate and regular rhythm.     Pulses: Normal pulses.     Heart sounds: Normal heart sounds.  Pulmonary:     Effort: Pulmonary effort is normal.     Breath sounds: Normal breath sounds.  Abdominal:     General: Bowel sounds are normal.  Musculoskeletal:     Right lower leg: No edema.     Left lower leg: No edema.  Lymphadenopathy:     Cervical: No cervical adenopathy.  Neurological:     General: No focal deficit present.     Mental Status: He is alert.     Deep Tendon Reflexes:     Reflex Scores:      Bicep reflexes are 2+ on the right side and 2+ on the left side.  Patellar reflexes are 2+ on the right side and 2+ on the left side.    Comments: Bilateral upper and lower extremity strength 5/5  Psychiatric:        Mood and Affect: Mood normal.        Behavior: Behavior normal.        Thought Content: Thought content normal.        Judgment: Judgment normal.     Diabetic Foot Form - Detailed   Diabetic Foot Exam - detailed Diabetic Foot exam was performed with the following findings: Yes   Is there swelling or and abnormal foot shape?: No Is there a claw toe deformity?: No Is there elevated skin temparature?: No Pulse Foot Exam completed.: Yes   Right posterior Tibialias: Present Left posterior Tibialias: Present   Right Dorsalis Pedis: Present Left Dorsalis Pedis: Present  Sensory Foot Exam Completed.: Yes Semmes-Weinstein Monofilament Test   Comments: All 10 sites bilateral feet sensation intact     Results for orders  placed or performed in visit on 01/01/22  POCT glycosylated hemoglobin (Hb A1C)  Result Value Ref Range   Hemoglobin A1C 6.2 (A) 4.0 - 5.6 %   HbA1c POC (<> result, manual entry)     HbA1c, POC (prediabetic range)     HbA1c, POC (controlled diabetic range)        The ASCVD Risk score (Arnett DK, et al., 2019) failed to calculate for the following reasons:   The valid total cholesterol range is 130 to 320 mg/dL    Assessment & Plan:   Problem List Items Addressed This Visit       Digestive   Cirrhosis of liver [facility])    Currently followed by [facility].  Last visit seems to be in August 2023.  Continue following with them as recommended        Endocrine   Type 2 diabetes mellitus with other specified complication ([place]) - Primary    Patient has had an appreciable drop in blood glucose.  Today's A1c is 6.2%.  Tolerating medications well.  Will back off of Jardiance since patient is on full dose of Ozempic 2 mg weekly.  Been on medication approximately 3 weeks so anticipate continued drop with weight loss.      Relevant Medications   metFORMIN (GLUCOPHAGE-XR) 500 MG 24 hr tablet   Other Relevant Orders   POCT glycosylated hemoglobin (Hb A1C) (Completed)   Microalbumin/Creatinine Ratio, Urine   Basic metabolic panel     Other   Hyperlipidemia    Currently maintained on atorvastatin 10 mg.      Other Visit Diagnoses     Need for shingles vaccine       Relevant Orders   Zoster Recombinant (Shingrix ) (Completed)   Need for influenza vaccination       Screening for prostate cancer       Relevant Orders   PSA       Return in about 3 months (around 04/03/2022) for DM recheck .    Romilda Garret, NP

## 2022-01-03 ENCOUNTER — Telehealth: Payer: Self-pay

## 2022-01-03 NOTE — Telephone Encounter (Signed)
Left message to return call to our office.  Specimen was not processed. Patient needs to have labs re drawn. Future orders have been placed. Appointment needs to be made.

## 2022-01-03 NOTE — Telephone Encounter (Signed)
Patient called and has been scheduled for labs on 01/17/2022 at 1:00 pm.

## 2022-01-03 NOTE — Addendum Note (Signed)
Addended by: Francella Solian on: 01/03/2022 09:51 AM   Modules accepted: Orders

## 2022-01-17 ENCOUNTER — Other Ambulatory Visit: Payer: BC Managed Care – PPO

## 2022-01-20 ENCOUNTER — Other Ambulatory Visit: Payer: BC Managed Care – PPO

## 2022-01-23 ENCOUNTER — Other Ambulatory Visit (INDEPENDENT_AMBULATORY_CARE_PROVIDER_SITE_OTHER): Payer: BC Managed Care – PPO

## 2022-01-23 DIAGNOSIS — Z125 Encounter for screening for malignant neoplasm of prostate: Secondary | ICD-10-CM

## 2022-01-23 DIAGNOSIS — E1169 Type 2 diabetes mellitus with other specified complication: Secondary | ICD-10-CM

## 2022-01-23 LAB — MICROALBUMIN / CREATININE URINE RATIO
Creatinine,U: 70.6 mg/dL
Microalb Creat Ratio: 1 mg/g (ref 0.0–30.0)
Microalb, Ur: <0.7 mg/dL (ref 0.0–1.9)

## 2022-01-23 LAB — BASIC METABOLIC PANEL
BUN: 13 mg/dL (ref 6–23)
CO2: 30 mEq/L (ref 19–32)
Calcium: 9.4 mg/dL (ref 8.4–10.5)
Chloride: 101 mEq/L (ref 96–112)
Creatinine, Ser: 0.92 mg/dL (ref 0.40–1.50)
GFR: 92.57 mL/min (ref >60.00)
Glucose, Bld: 164 mg/dL — ABNORMAL HIGH (ref 70–99)
Potassium: 4.9 mEq/L (ref 3.5–5.1)
Sodium: 137 mEq/L (ref 135–145)

## 2022-01-23 LAB — PSA: PSA: 0.79 ng/mL (ref 0.10–4.00)

## 2022-01-27 ENCOUNTER — Other Ambulatory Visit: Payer: Self-pay

## 2022-01-27 DIAGNOSIS — E1169 Type 2 diabetes mellitus with other specified complication: Secondary | ICD-10-CM

## 2022-01-27 MED ORDER — SEMAGLUTIDE (2 MG/DOSE) 8 MG/3ML ~~LOC~~ SOPN: 2.0000 mg | PEN_INJECTOR | SUBCUTANEOUS | 1 refills | Status: DC

## 2022-01-27 NOTE — Telephone Encounter (Signed)
Last Visit 01/01/2022 Next Visit 04/03/2022

## 2022-04-03 ENCOUNTER — Other Ambulatory Visit: Payer: Self-pay | Admitting: Nurse Practitioner

## 2022-04-03 ENCOUNTER — Encounter: Payer: Self-pay | Admitting: Nurse Practitioner

## 2022-04-03 ENCOUNTER — Ambulatory Visit: Payer: BC Managed Care – PPO | Admitting: Nurse Practitioner

## 2022-04-03 VITALS — BP 122/74 | HR 82 | Temp 98.2°F | Ht 72.0 in | Wt 275.6 lb

## 2022-04-03 DIAGNOSIS — E1169 Type 2 diabetes mellitus with other specified complication: Secondary | ICD-10-CM | POA: Diagnosis not present

## 2022-04-03 DIAGNOSIS — K7469 Other cirrhosis of liver: Secondary | ICD-10-CM | POA: Diagnosis not present

## 2022-04-03 LAB — POCT GLYCOSYLATED HEMOGLOBIN (HGB A1C): Hemoglobin A1C: 7 % — AB (ref 4.0–5.6)

## 2022-04-03 NOTE — Progress Notes (Signed)
Established Patient Office Visit  Subjective   Patient ID: Robert Holland, male    DOB: Jul 23, 1965  Age: 57 y.o. MRN: [id number]  Chief Complaint  Patient presents with   Diabetes      DM2: Patient is currently on ozempic 2 mg and metformin. We did discontinue the jardiance last office visit. States that he does not check sugars at home Hypoglycemia Hyperglycemia  States that no exercise outside of work. States that he is planning to start walking States holidays diet was poor. States that it has improved        Review of Systems  Constitutional:  Negative for chills and fever.  Respiratory:  Negative for shortness of breath.   Cardiovascular:  Negative for chest pain.  Gastrointestinal:  Negative for abdominal pain, diarrhea, nausea and vomiting.       BM daily   Neurological:  Negative for tingling and headaches.      Objective:     BP 122/74   Pulse 82   Temp 98.2 F (36.8 C) (Oral)   Ht 6' (1.829 m)   Wt 275 lb 9.6 oz (125 kg)   SpO2 99%   BMI 37.38 kg/m  BP Readings from Last 3 Encounters:  04/03/22 122/74  01/01/22 124/82  09/30/21 138/82   Wt Readings from Last 3 Encounters:  04/03/22 275 lb 9.6 oz (125 kg)  01/01/22 261 lb 2 oz (118.4 kg)  09/30/21 268 lb 4 oz (121.7 kg)      Physical Exam Vitals and nursing note reviewed.  Constitutional:      Appearance: Normal appearance.  Cardiovascular:     Rate and Rhythm: Normal rate and regular rhythm.     Pulses:          Dorsalis pedis pulses are 2+ on the right side and 2+ on the left side.       Posterior tibial pulses are 2+ on the right side and 2+ on the left side.     Heart sounds: Normal heart sounds.  Pulmonary:     Effort: Pulmonary effort is normal.     Breath sounds: Normal breath sounds.  Musculoskeletal:     Right lower leg: No edema.     Left lower leg: No edema.  Feet:     Right foot:     Skin integrity: Skin integrity normal.     Left foot:     Skin integrity: Skin  integrity normal.  Neurological:     Mental Status: He is alert.      Results for orders placed or performed in visit on 04/03/22  POCT HgB A1C  Result Value Ref Range   Hemoglobin A1C 7.0 (A) 4.0 - 5.6 %   HbA1c POC (<> result, manual entry)     HbA1c, POC (prediabetic range)     HbA1c, POC (controlled diabetic range)        The ASCVD Risk score (Arnett DK, et al., 2019) failed to calculate for the following reasons:   The valid total cholesterol range is 130 to 320 mg/dL    Assessment & Plan:   Problem List Items Addressed This Visit       Digestive   Cirrhosis of liver [facility])    Patient is followed by the liver clinic.  Recently saw them approximately 3 days ago.  Did review labs that they performed        Endocrine   Type 2 diabetes mellitus with other specified complication ([place]) - Primary  Patient currently maintained on Ozempic 2 mg and metformin 500 mg daily.  A1c did trend up to 7.0.  Patient has not been the best on diet will allow patient a chance to work on lifestyle modification inclusive of diet and exercise.  If A1c is elevated at next visit consider going back on Jardiance      Relevant Orders   POCT HgB A1C (Completed)    Return in about 3 months (around 07/02/2022) for DM recheck.    Romilda Garret, NP

## 2022-04-03 NOTE — Patient Instructions (Signed)
Nice to see you today Work on your lifestyle changes. Exercise will make a big difference I have attached information on the Mediterranean Diet  Follow up with me in 3 months, sooner if you need me No medication changes at today's visit

## 2022-04-03 NOTE — Assessment & Plan Note (Addendum)
Patient is followed by the liver clinic.  Recently saw them approximately 3 days ago.  Did review labs that they performed

## 2022-04-03 NOTE — Assessment & Plan Note (Signed)
Patient currently maintained on Ozempic 2 mg and metformin 500 mg daily.  A1c did trend up to 7.0.  Patient has not been the best on diet will allow patient a chance to work on lifestyle modification inclusive of diet and exercise.  If A1c is elevated at next visit consider going back on Jardiance

## 2022-05-22 ENCOUNTER — Other Ambulatory Visit: Payer: Self-pay | Admitting: Nurse Practitioner

## 2022-05-22 DIAGNOSIS — E1169 Type 2 diabetes mellitus with other specified complication: Secondary | ICD-10-CM

## 2022-07-03 ENCOUNTER — Ambulatory Visit: Payer: BC Managed Care – PPO | Admitting: Nurse Practitioner

## 2022-07-03 ENCOUNTER — Encounter: Payer: Self-pay | Admitting: Nurse Practitioner

## 2022-07-03 VITALS — BP 126/74 | HR 84 | Temp 98.4°F | Resp 16 | Ht 72.0 in | Wt 277.1 lb

## 2022-07-03 DIAGNOSIS — E1169 Type 2 diabetes mellitus with other specified complication: Secondary | ICD-10-CM

## 2022-07-03 DIAGNOSIS — Z23 Encounter for immunization: Secondary | ICD-10-CM

## 2022-07-03 DIAGNOSIS — E669 Obesity, unspecified: Secondary | ICD-10-CM

## 2022-07-03 DIAGNOSIS — Z7985 Long-term (current) use of injectable non-insulin antidiabetic drugs: Secondary | ICD-10-CM | POA: Diagnosis not present

## 2022-07-03 DIAGNOSIS — Z7984 Long term (current) use of oral hypoglycemic drugs: Secondary | ICD-10-CM | POA: Diagnosis not present

## 2022-07-03 DIAGNOSIS — R6884 Jaw pain: Secondary | ICD-10-CM | POA: Insufficient documentation

## 2022-07-03 DIAGNOSIS — Z6837 Body mass index (BMI) 37.0-37.9, adult: Secondary | ICD-10-CM

## 2022-07-03 LAB — POCT GLYCOSYLATED HEMOGLOBIN (HGB A1C): Hemoglobin A1C: 7.4 % — AB (ref 4.0–5.6)

## 2022-07-03 MED ORDER — METFORMIN HCL ER 500 MG PO TB24
1000.0000 mg | ORAL_TABLET | Freq: Every day | ORAL | 1 refills | Status: DC
Start: 2022-07-03 — End: 2023-07-22

## 2022-07-03 NOTE — Assessment & Plan Note (Signed)
Patient was on metformin 500 mg daily along with Ozempic 2 mg weekly.  A1c went from 7.0-7.4.  Will increase metformin to 1000 mg daily new prescription sent to pharmacy.  Patient to continue working lifestyle modifications

## 2022-07-03 NOTE — Progress Notes (Signed)
Established Patient Office Visit  Subjective   Patient ID: Robert Holland, male    DOB: 1965-04-25  Age: 57 y.o. MRN: [id number]  Chief Complaint  Patient presents with   Diabetes      DM2: patinet is currenlty on ozempic 2mg  and metformin 500mg  daily. States that he does not check it but has supply States more at home cooked meals. 2-3 meals a day. Sometimes he snacks. States that he drinks water, sweet tea (1/2 and 1/2). Exercise: with employment   Jaw pain: states that he was chewing it on that side he noticed. States that he has tried tylenol and ibuprofen. States that it get worse through the day. States he does grit his teeth      Review of Systems  Constitutional:  Negative for chills and fever.  Respiratory:  Negative for shortness of breath.   Cardiovascular:  Negative for chest pain.  Gastrointestinal:  Negative for abdominal pain, constipation, diarrhea, nausea and vomiting.       Bm daily   Neurological:  Negative for headaches.      Objective:     BP 126/74   Pulse 84   Temp 98.4 F (36.9 C)   Resp 16   Ht 6' (1.829 m)   Wt 277 lb 2 oz (125.7 kg)   SpO2 97%   BMI 37.58 kg/m  BP Readings from Last 3 Encounters:  07/03/22 126/74  04/03/22 122/74  01/01/22 124/82   Wt Readings from Last 3 Encounters:  07/03/22 277 lb 2 oz (125.7 kg)  04/03/22 275 lb 9.6 oz (125 kg)  01/01/22 261 lb 2 oz (118.4 kg)      Physical Exam Vitals and nursing note reviewed.  Constitutional:      Appearance: Normal appearance.  HENT:     Head:     Jaw: Pain on movement present.      Comments: No reddened gums, dental abscess, or signs of infection in mouth     Right Ear: Tympanic membrane, ear canal and external ear normal.     Left Ear: Tympanic membrane, ear canal and external ear normal.  Cardiovascular:     Rate and Rhythm: Normal rate and regular rhythm.     Pulses:          Dorsalis pedis pulses are 2+ on the right side and 2+ on the left side.     Heart  sounds: Normal heart sounds.  Pulmonary:     Effort: Pulmonary effort is normal.     Breath sounds: Normal breath sounds.  Abdominal:     General: Bowel sounds are normal.  Musculoskeletal:     Right lower leg: No edema.     Left lower leg: No edema.  Feet:     Right foot:     Skin integrity: Skin integrity normal.     Left foot:     Skin integrity: Skin integrity normal.  Neurological:     Mental Status: He is alert.      Results for orders placed or performed in visit on 07/03/22  POCT glycosylated hemoglobin (Hb A1C)  Result Value Ref Range   Hemoglobin A1C 7.4 (A) 4.0 - 5.6 %   HbA1c POC (<> result, manual entry)     HbA1c, POC (prediabetic range)     HbA1c, POC (controlled diabetic range)        The ASCVD Risk score (Arnett DK, et al., 2019) failed to calculate for the following reasons:   The valid  total cholesterol range is 130 to 320 mg/dL    Assessment & Plan:   Problem List Items Addressed This Visit       Endocrine   Type 2 diabetes mellitus with other specified complication (HCC) - Primary    Patient was on metformin 500 mg daily along with Ozempic 2 mg weekly.  A1c went from 7.0-7.4.  Will increase metformin to 1000 mg daily new prescription sent to pharmacy.  Patient to continue working lifestyle modifications      Relevant Medications   metFORMIN (GLUCOPHAGE-XR) 500 MG 24 hr tablet   Other Relevant Orders   POCT glycosylated hemoglobin (Hb A1C) (Completed)     Other   Obesity (BMI 35.0-39.9 without comorbidity)    Patient has gained weight since last office it.  He is on Ozempic 2 mg.  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  He is followed by dentistry they recommend him to get a mouthpiece but he has not gotten one yet.  Continue over-the-counter analgesics as needed benign exam  today      Other Visit Diagnoses     Need for shingles vaccine       Relevant Orders   Zoster Recombinant (Shingrix ) (Completed)       Return in about 3 months (around 10/03/2022) for DM recheck.    Audria Nine, NP

## 2022-07-03 NOTE — Assessment & Plan Note (Signed)
Patient has gained weight since last office it.  He is on Ozempic 2 mg.  Continue work on lifestyle modifications

## 2022-07-03 NOTE — Patient Instructions (Signed)
Nice to see you today  I have increased the metformin to 2 tablets in the morning I want to see you in approx 3 months

## 2022-07-03 NOTE — Assessment & Plan Note (Signed)
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  He is followed by dentistry they recommend him to get a mouthpiece but he has not gotten one yet.  Continue over-the-counter analgesics as needed benign exam today

## 2022-08-19 ENCOUNTER — Other Ambulatory Visit: Payer: Self-pay | Admitting: Nurse Practitioner

## 2022-08-19 DIAGNOSIS — E1169 Type 2 diabetes mellitus with other specified complication: Secondary | ICD-10-CM

## 2022-09-17 ENCOUNTER — Encounter (INDEPENDENT_AMBULATORY_CARE_PROVIDER_SITE_OTHER): Payer: Self-pay

## 2022-09-25 ENCOUNTER — Telehealth: Payer: Self-pay

## 2022-09-25 DIAGNOSIS — E785 Hyperlipidemia, unspecified: Secondary | ICD-10-CM

## 2022-09-25 MED ORDER — ATORVASTATIN CALCIUM 10 MG PO TABS
10.0000 mg | ORAL_TABLET | Freq: Every day | ORAL | 3 refills | Status: DC
Start: 2022-09-25 — End: 2023-11-05

## 2022-09-25 NOTE — Telephone Encounter (Signed)
Refill sent in

## 2022-09-25 NOTE — Addendum Note (Signed)
Addended by: Eden Emms on: 09/25/2022 11:34 AM   Modules accepted: Orders

## 2022-09-25 NOTE — Telephone Encounter (Signed)
LAST APPOINTMENT DATE: 07/03/22   NEXT APPOINTMENT DATE: 10/03/2022  Atorvastatin 10mg   LAST REFILL: 09/12/21  QTY: #[id number]

## 2022-10-03 ENCOUNTER — Encounter: Payer: Self-pay | Admitting: Nurse Practitioner

## 2022-10-03 ENCOUNTER — Ambulatory Visit: Payer: BC Managed Care – PPO | Admitting: Nurse Practitioner

## 2022-10-03 VITALS — BP 118/80 | HR 76 | Temp 96.0°F | Ht 73.0 in | Wt 261.6 lb

## 2022-10-03 DIAGNOSIS — Z7984 Long term (current) use of oral hypoglycemic drugs: Secondary | ICD-10-CM | POA: Diagnosis not present

## 2022-10-03 DIAGNOSIS — E1169 Type 2 diabetes mellitus with other specified complication: Secondary | ICD-10-CM

## 2022-10-03 DIAGNOSIS — Z6834 Body mass index (BMI) 34.0-34.9, adult: Secondary | ICD-10-CM

## 2022-10-03 DIAGNOSIS — E669 Obesity, unspecified: Secondary | ICD-10-CM | POA: Diagnosis not present

## 2022-10-03 LAB — CBC
HCT: 47.7 % (ref 39.0–52.0)
Hemoglobin: 16.3 g/dL (ref 13.0–17.0)
MCHC: 34.2 g/dL (ref 30.0–36.0)
MCV: 91.1 fl (ref 78.0–100.0)
Platelets: 144 10*3/uL — ABNORMAL LOW (ref 150.0–400.0)
RBC: 5.24 Mil/uL (ref 4.22–5.81)
RDW: 12.6 % (ref 11.5–15.5)
WBC: 5 10*3/uL (ref 4.0–10.5)

## 2022-10-03 LAB — COMPREHENSIVE METABOLIC PANEL
ALT: 68 U/L — ABNORMAL HIGH (ref 0–53)
AST: 52 U/L — ABNORMAL HIGH (ref 0–37)
Albumin: 4.4 g/dL (ref 3.5–5.2)
Alkaline Phosphatase: 77 U/L (ref 39–117)
BUN: 14 mg/dL (ref 6–23)
CO2: 28 mEq/L (ref 19–32)
Calcium: 9.5 mg/dL (ref 8.4–10.5)
Chloride: 95 mEq/L — ABNORMAL LOW (ref 96–112)
Creatinine, Ser: 0.99 mg/dL (ref 0.40–1.50)
GFR: 84.36 mL/min (ref >60.00)
Glucose, Bld: 368 mg/dL — ABNORMAL HIGH (ref 70–99)
Potassium: 4.3 mEq/L (ref 3.5–5.1)
Sodium: 130 mEq/L — ABNORMAL LOW (ref 135–145)
Total Bilirubin: 1.2 mg/dL (ref 0.2–1.2)
Total Protein: 7.1 g/dL (ref 6.0–8.3)

## 2022-10-03 LAB — POCT GLYCOSYLATED HEMOGLOBIN (HGB A1C): Hemoglobin A1C: 9.6 % — AB (ref 4.0–5.6)

## 2022-10-03 MED ORDER — EMPAGLIFLOZIN 10 MG PO TABS
10.0000 mg | ORAL_TABLET | Freq: Every day | ORAL | 1 refills | Status: DC
Start: 2022-10-03 — End: 2023-05-28

## 2022-10-03 NOTE — Progress Notes (Signed)
Established Patient Office Visit  Subjective   Patient ID: Robert Holland, male    DOB: [birth year]  Age: 57 y.o. MRN: [id number]  Chief Complaint  Patient presents with   Diabetes    Pt states he feels good       DM2: patient currently maintained on metformin 1000mg  BID and ozempic 2 mg. Last A1C was 7.4 State that he has not been checking sugars at home States that he is doing well on the metformin. States that he has been having toruble getting hte ozempic per patient report he has had a period of approximately 2 weeks without Ozempic  States 3 meals a day with infrequent snacks. He is doing 1/2 tea and water States that he has been doing yard work  Of note patient has lost weight since last office visit.  Approximately 16 pounds    Review of Systems  Constitutional:  Negative for chills and fever.  Respiratory:  Negative for shortness of breath.   Cardiovascular:  Negative for chest pain.  Gastrointestinal:  Negative for abdominal pain, constipation, diarrhea, nausea and vomiting.  Neurological:  Negative for headaches.  Psychiatric/Behavioral:  Negative for hallucinations and suicidal ideas.       Objective:     BP 118/80   Pulse 76   Temp (!) 96 F (35.6 C) (Temporal)   Ht 6\' 1"  (1.854 m)   Wt 261 lb 9.6 oz (118.7 kg)   SpO2 96%   BMI 34.51 kg/m  BP Readings from Last 3 Encounters:  10/03/22 118/80  07/03/22 126/74  04/03/22 122/74   Wt Readings from Last 3 Encounters:  10/03/22 261 lb 9.6 oz (118.7 kg)  07/03/22 277 lb 2 oz (125.7 kg)  04/03/22 275 lb 9.6 oz (125 kg)      Physical Exam Vitals and nursing note reviewed.  Constitutional:      Appearance: Normal appearance.  Cardiovascular:     Rate and Rhythm: Normal rate and regular rhythm.     Pulses:          Dorsalis pedis pulses are 2+ on the right side and 2+ on the left side.       Posterior tibial pulses are 2+ on the right side and 2+ on the left side.     Heart sounds: Normal heart  sounds.  Pulmonary:     Effort: Pulmonary effort is normal.     Breath sounds: Normal breath sounds.  Abdominal:     General: Bowel sounds are normal.  Musculoskeletal:     Right lower leg: No edema.     Left lower leg: No edema.  Feet:     Right foot:     Skin integrity: Skin integrity normal.     Left foot:     Skin integrity: Skin integrity normal.  Neurological:     Mental Status: He is alert.      Results for orders placed or performed in visit on 10/03/22  POCT glycosylated hemoglobin (Hb A1C)  Result Value Ref Range   Hemoglobin A1C 9.6 (A) 4.0 - 5.6 %   HbA1c POC (<> result, manual entry)     HbA1c, POC (prediabetic range)     HbA1c, POC (controlled diabetic range)        The ASCVD Risk score (Arnett DK, et al., 2019) failed to calculate for the following reasons:   The valid total cholesterol range is 130 to 320 mg/dL    Assessment & Plan:   Problem List Items  Addressed This Visit       Endocrine   Type 2 diabetes mellitus with other specified complication ([facility]) - Primary    Patient currently on semaglutide 2 mg weekly, metformin 1000 mg daily.  Patient's A1c is elevated today.  Will continue metformin and semaglutide as prescribed we will add back on Jardiance 10 mg which patient has tolerated well in the past.  Follow-up in 3 months continue working healthy lifestyle modifications.      Relevant Medications   empagliflozin (JARDIANCE) 10 MG TABS tablet   Other Relevant Orders   POCT glycosylated hemoglobin (Hb A1C) (Completed)   CBC   Comprehensive metabolic panel     Other   Obesity (BMI 35.0-39.9 without comorbidity)    Patient did have an appreciable weight loss.  Continue work on lifestyle applications.      Relevant Medications   empagliflozin (JARDIANCE) 10 MG TABS tablet    Return in about 3 months (around 01/03/2023) for DM recheck.    Audria Nine, NP

## 2022-10-03 NOTE — Assessment & Plan Note (Signed)
Patient did have an appreciable weight loss.  Continue work on lifestyle applications.

## 2022-10-03 NOTE — Assessment & Plan Note (Addendum)
Patient currently on semaglutide 2 mg weekly, metformin 1000 mg daily.  Patient's A1c is elevated today.  Will continue metformin and semaglutide as prescribed we will add back on Jardiance 10 mg which patient has tolerated well in the past.  Follow-up in 3 months continue working healthy lifestyle modifications.

## 2022-10-03 NOTE — Patient Instructions (Signed)
Nice to see you today Your A1C was 9.6% today. I am going to add back on the Jardiance Check your sugar 3 times a week for me Follow up in 3 months, sooner if you need me I will be in touch with the lab results once I have them

## 2022-11-03 ENCOUNTER — Telehealth: Payer: Self-pay | Admitting: Nurse Practitioner

## 2022-11-03 NOTE — Telephone Encounter (Signed)
Patient's spouse contacted the office requesting a prior auth for medication OZEMPIC, 2 MG/DOSE, 8 MG/3ML SOPN, please advise patient if any issues

## 2022-11-04 ENCOUNTER — Other Ambulatory Visit (HOSPITAL_COMMUNITY): Payer: Self-pay

## 2022-11-04 NOTE — Telephone Encounter (Signed)
Per test claim, PA is not required, pharmacy may need to rerun under today's date along with dispensed quantity of 3ml per 28 days, attempted to call pharmacy, no answer for at least 15 minutes.

## 2022-11-06 ENCOUNTER — Telehealth: Payer: Self-pay

## 2022-11-06 ENCOUNTER — Other Ambulatory Visit (HOSPITAL_COMMUNITY): Payer: Self-pay

## 2022-11-06 NOTE — Telephone Encounter (Signed)
error 

## 2022-11-06 NOTE — Telephone Encounter (Deleted)
error 

## 2022-11-07 ENCOUNTER — Other Ambulatory Visit (HOSPITAL_COMMUNITY): Payer: Self-pay

## 2023-01-05 ENCOUNTER — Ambulatory Visit: Payer: BC Managed Care – PPO | Admitting: Nurse Practitioner

## 2023-01-05 ENCOUNTER — Encounter: Payer: Self-pay | Admitting: Nurse Practitioner

## 2023-01-05 VITALS — BP 124/86 | HR 77 | Temp 98.1°F | Ht 73.0 in | Wt 253.0 lb

## 2023-01-05 DIAGNOSIS — E1169 Type 2 diabetes mellitus with other specified complication: Secondary | ICD-10-CM

## 2023-01-05 DIAGNOSIS — Z7984 Long term (current) use of oral hypoglycemic drugs: Secondary | ICD-10-CM | POA: Diagnosis not present

## 2023-01-05 DIAGNOSIS — Z23 Encounter for immunization: Secondary | ICD-10-CM

## 2023-01-05 LAB — POCT GLYCOSYLATED HEMOGLOBIN (HGB A1C): Hemoglobin A1C: 6.1 % — AB (ref 4.0–5.6)

## 2023-01-05 NOTE — Patient Instructions (Signed)
Nice to see you today Your A1C was 6.1%. That is fantastic. Keep up the good work We did update your flu shot today  Follow up with me in 3 months for your physical and full panel of labs. Sooner if you need me

## 2023-01-05 NOTE — Progress Notes (Signed)
Established Patient Office Visit  Subjective   Patient ID: Robert Holland, male    DOB: September 16, 1965  Age: 57 y.o. MRN: [id number]  Chief Complaint  Patient presents with   Diabetes    Pt states he has been feeling pretty good. Would like flu shot      DM2: patient is currently on Ozempic 2 mg weekly, metformin 1000 mg daily and we added Jardiance 10 mg daily as A1c was elevated. Traditionally patient is not checking glucose at home.  Last time he went approximately 2 weeks without Ozempic due to a stock issue.  States that he was checking theevery day and they were in the 220s and some that was the 150s. States that he has not checked it for the past month   States that he is doing 2 meals a day. 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 Staets that he is drinking water and half and half tea   Review of Systems  Constitutional:  Negative for chills and fever.  Respiratory:  Negative for shortness of breath.   Cardiovascular:  Negative for chest pain.  Gastrointestinal:  Negative for abdominal pain, constipation, diarrhea, nausea and vomiting.       BM daily       Objective:     BP 124/86   Pulse 77   Temp 98.1 F (36.7 C) (Oral)   Ht 6\' 1"  (1.854 m)   Wt 253 lb (114.8 kg)   SpO2 97%   BMI 33.38 kg/m  BP Readings from Last 3 Encounters:  01/05/23 124/86  10/03/22 118/80  07/03/22 126/74   Wt Readings from Last 3 Encounters:  01/05/23 253 lb (114.8 kg)  10/03/22 261 lb 9.6 oz (118.7 kg)  07/03/22 277 lb 2 oz (125.7 kg)      Physical Exam Vitals and nursing note reviewed.  Constitutional:      Appearance: Normal appearance.  Cardiovascular:     Rate and Rhythm: Normal rate and regular rhythm.     Heart sounds: Normal heart sounds.  Pulmonary:     Effort: Pulmonary effort is normal.     Breath sounds: Normal breath sounds.  Abdominal:     General: Bowel sounds are normal.  Neurological:     Mental Status: He is alert.    Title    Diabetic Foot Exam - detailed Date & Time: 01/05/2023  8:23 AM Diabetic Foot exam was performed with the following findings: Yes  Is there a history of foot ulcer?: No Is there a foot ulcer now?: No Is there swelling?: No Is there elevated skin temperature?: No Is there abnormal foot shape?: No Is there a claw toe deformity?: No Are the toenails long?: No Are the toenails thick?: No Are the toenails ingrown?: No Pulse Foot Exam completed.: Yes   Right Posterior Tibialis: Present Left posterior Tibialis: Present   Right Dorsalis Pedis: Present Left Dorsalis Pedis: Present     Sensory Foot Exam Completed.: Yes Semmes-Weinstein Monofilament Test "+" means "has sensation" and "-" means "no sensation"      Image components are not supported.   Image components are not supported. Image components are not supported.  Tuning Fork Comments All 10 sites sensation intact bilaterally.   Callus forming on bilateral great toe MTP medially        Results for orders placed or performed in visit on 01/05/23  POCT glycosylated hemoglobin (Hb A1C)  Result Value Ref Range   Hemoglobin  A1C 6.1 (A) 4.0 - 5.6 %   HbA1c POC (<> result, manual entry)     HbA1c, POC (prediabetic range)     HbA1c, POC (controlled diabetic range)        The ASCVD Risk score (Arnett DK, et al., 2019) failed to calculate for the following reasons:   The valid total cholesterol range is 130 to 320 mg/dL    Assessment & Plan:   Problem List Items Addressed This Visit       Endocrine   Type 2 diabetes mellitus with other specified complication ([facility]) - Primary    Patient had a great reduction in A1c.  Will continue Jardiance 10 mg, Ozempic 2 mg weekly, metformin 1000 mg daily.  Continue work on lifestyle modifications.  Patient has lost even more weight since last office visit.      Relevant Orders   POCT glycosylated hemoglobin (Hb A1C) (Completed)   Other Visit Diagnoses     Need for influenza  vaccination       Relevant Orders   Flu vaccine trivalent PF, 6mos and older(Flulaval,Afluria,Fluarix,Fluzone) (Completed)       Return in about 3 months (around 04/07/2023) for CPE and Labs, DM recheck.    Audria Nine, NP

## 2023-01-05 NOTE — Assessment & Plan Note (Signed)
Patient had a great reduction in A1c.  Will continue Jardiance 10 mg, Ozempic 2 mg weekly, metformin 1000 mg daily.  Continue work on lifestyle modifications.  Patient has lost even more weight since last office visit.

## 2023-02-20 IMAGING — CT CT RENAL STONE PROTOCOL
2 of 4 series · 15 of 46 positions shown, 17 images · non-contrast
Comparison: Abdominal ultrasound 01/05/2020

CLINICAL DATA: Left flank pain for 2-3 weeks.  Hematuria.



[Series 2: stone full · axial · 0.96mm/px · z∈[-513,-18]mm · 12 of 109 slices shown, 14 images]
[im 5/109  soft-tissue]
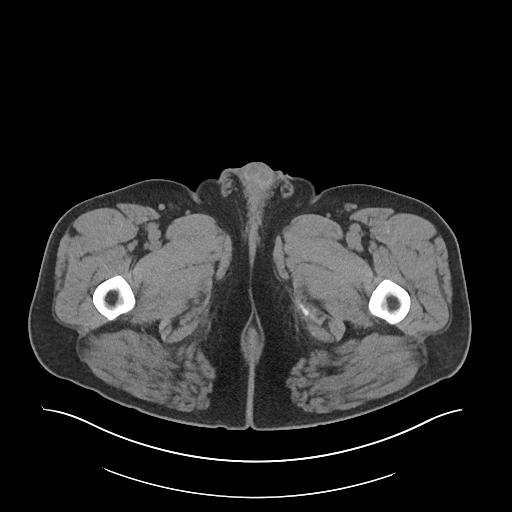
[im 5/109  bone]
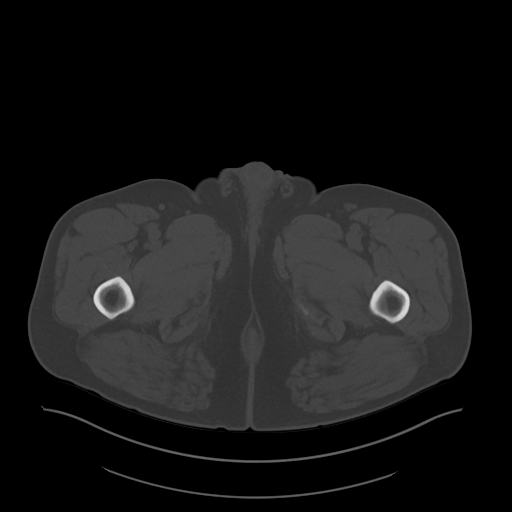
[im 14/109  soft-tissue]
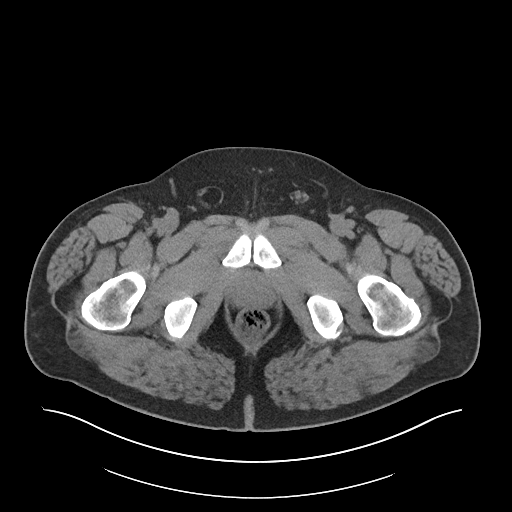
[im 23/109  soft-tissue]
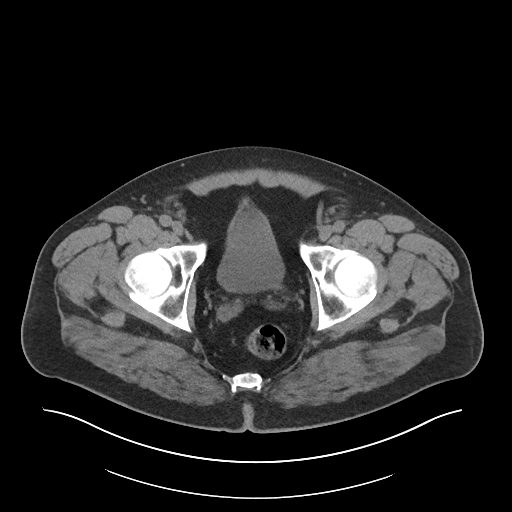
[im 32/109  soft-tissue]
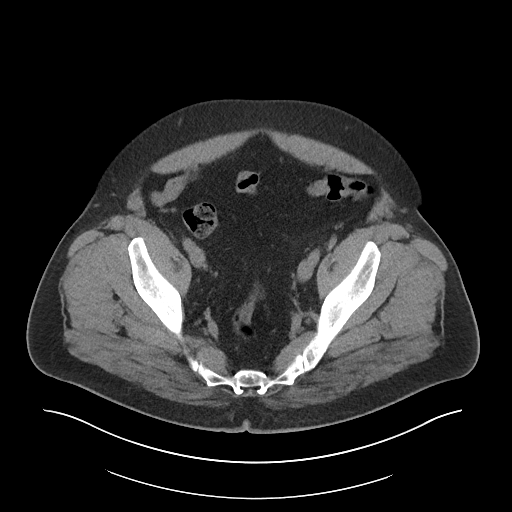
[im 41/109  soft-tissue]
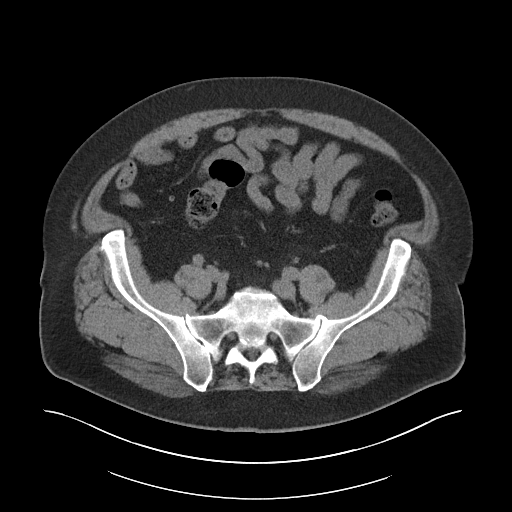
[im 50/109  soft-tissue]
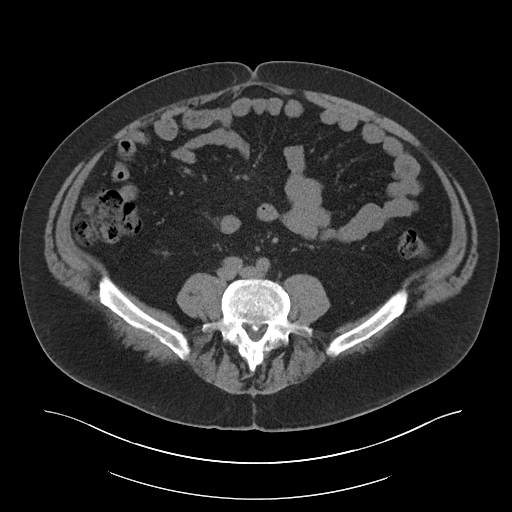
[im 59/109  soft-tissue]
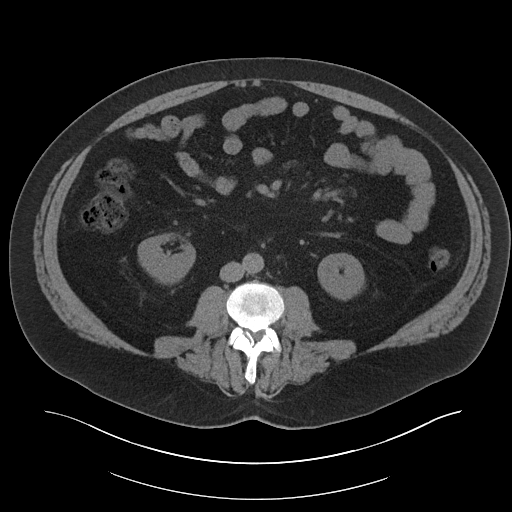
[im 68/109  soft-tissue]
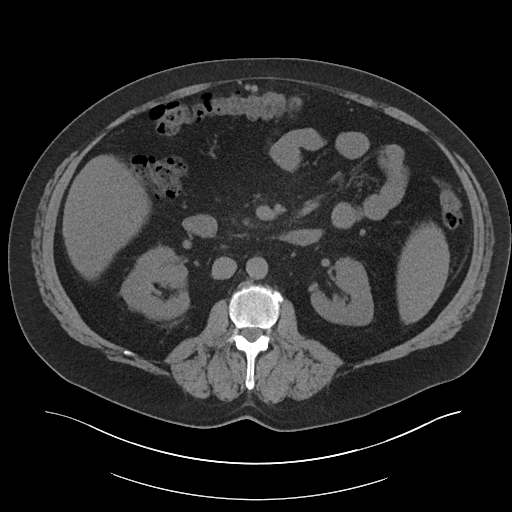
[im 77/109  soft-tissue]
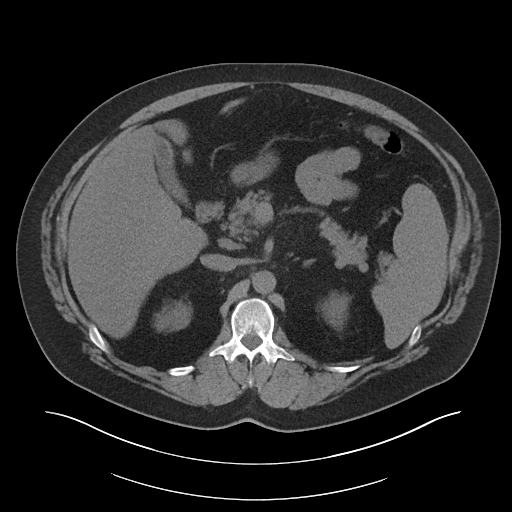
[im 77/109  bone]
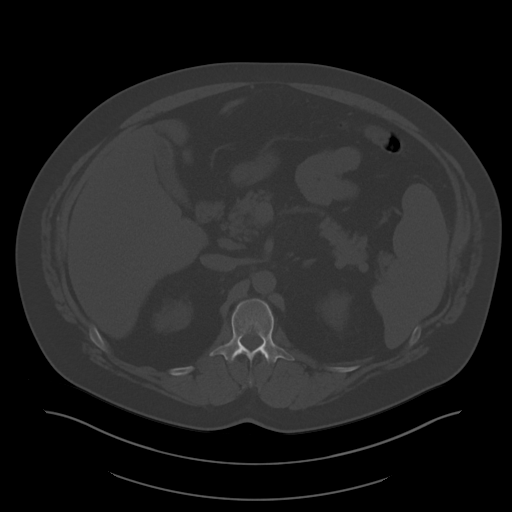
[im 86/109  soft-tissue]
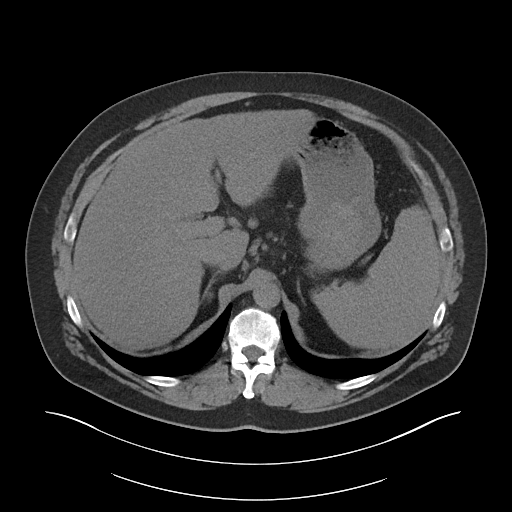
[im 95/109  soft-tissue]
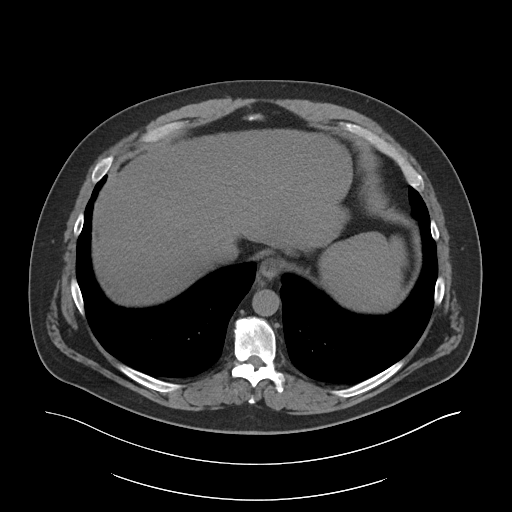
[im 104/109  soft-tissue]
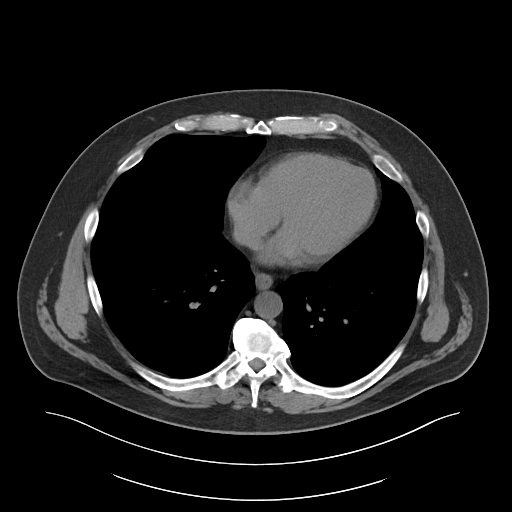

[Series 5: coronal · coronal · 0.95mm/px · 3 of 122 slices shown]
[im 41/122  soft-tissue]
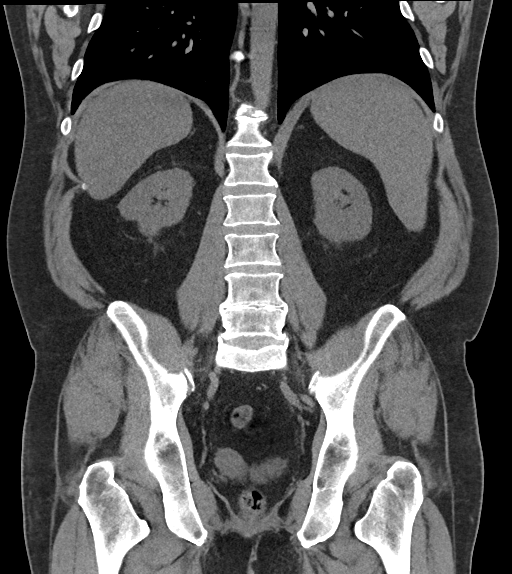
[im 54/122  soft-tissue]
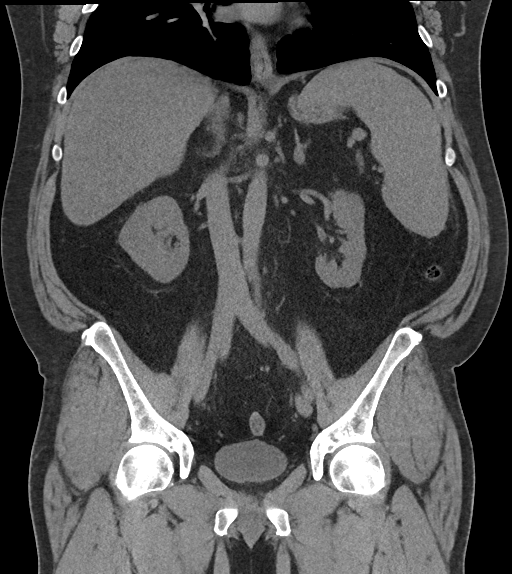
[im 68/122  soft-tissue]
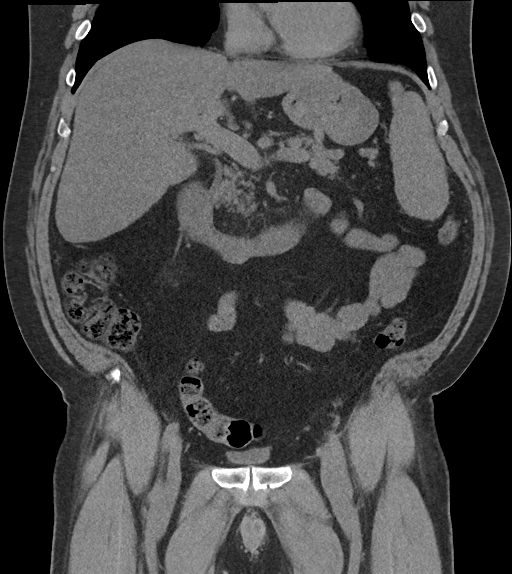

[15 of 46 positions shown; findings below may reference images not displayed]

FINDINGS: Lower chest: Distal paraesophageal lymph node 0.8 cm in short axis
on image 13 series 2.

Hepatobiliary: Nodular hepatic morphology suspicious for cirrhosis.
Contracted gallbladder. No biliary dilatation.

Pancreas: Unremarkable

Spleen: The spleen measures 14.1 by 7.7 by 16.5 cm (volume = 940
cm^3), compatible with splenomegaly.

Adrenals/Urinary Tract: Sub-centimeter nodularity of the left
adrenal gland without overt mass. At least partial duplication of
the right renal collecting system.

0.3 by 0.1 cm stone of the distal left ureter on image 49 series 5
and image 91 series 6. No hydronephrosis or additional urinary tract
calculi observed.

Stomach/Bowel: Unremarkable

Vascular/Lymphatic: Right gastric, celiac chain, and porta hepatis
lymph nodes are generally upper normal in size. A peripancreatic
node is mildly enlarged at 1.1 cm in short axis on image 26 series
2. Given the underlying cirrhosis these lymph nodes are probably
reactive.

Mild atherosclerotic calcification of the right common iliac artery.
A collateral venous structure extends from the falciform ligament
along the anterior abdominal wall towards the umbilicus, indicative
of portal venous hypertension.

Reproductive: Unremarkable

Other: No supplemental non-categorized findings.

Musculoskeletal: Lumbar and thoracic spondylosis and degenerative
disc disease with bilateral impingement at L5-S1 and left foraminal
impingement at L3-4 and L4-5. Suspected hemangioma at the T8
vertebral level. Nonspecific 1.5 cm in long axis sclerotic lesion
with some central lucency posteriorly in the T11 vertebral body on
the left (image 85, series 6). Interspinous fusion and possibly
partial facet fusion the L4-5 level. Substantial posterior spurring
at the L3-4 level. Small sclerotic lesions with central lucency in
the right iliac bone medially, nonspecific although probably benign.
IMPRESSION: 1. 1 by 3 mm left distal ureteral stone, but without current
hydronephrosis or hydroureter. No other renal calculi are
identified.
2. Partially duplicated right renal collecting system.
3. Hepatic cirrhosis with vascular findings of portal venous
hypertension. Moderate splenomegaly.
4. Lower lumbar impingement.
5. Sclerotic lesions in the T11 vertebral body and right iliac bone
are technically nonspecific although statistically likely to be
benign.

## 2023-03-03 ENCOUNTER — Other Ambulatory Visit: Payer: Self-pay | Admitting: Nurse Practitioner

## 2023-03-03 DIAGNOSIS — E1169 Type 2 diabetes mellitus with other specified complication: Secondary | ICD-10-CM

## 2023-03-03 MED ORDER — OZEMPIC (2 MG/DOSE) 8 MG/3ML ~~LOC~~ SOPN: 2.0000 mg | PEN_INJECTOR | SUBCUTANEOUS | 1 refills | Status: DC

## 2023-03-03 NOTE — Telephone Encounter (Signed)
 Copied from CRM [id number]. Topic: Clinical - Medication Refill >> Mar 03, 2023  8:30 AM Pinkey ORN wrote: Most Recent Primary Care Visit:  Provider: WENDEE LYNWOOD [name]  Department: [facility]  Visit Type: OFFICE VISIT  Date: 01/05/2023  Medication: OZEMPIC , 2 MG/DOSE, 8 MG/3ML SOPN  Has the patient contacted their pharmacy? Yes (Agent: If no, request that the patient contact the pharmacy for the refill. If patient does not wish to contact the pharmacy document the reason why and proceed with request.) (Agent: If yes, when and what did the pharmacy advise?)  Is this the correct pharmacy for this prescription? Yes If no, delete pharmacy and type the correct one.  This is the patient's preferred pharmacy:  [id number] - [place], [place] - [address] AT [facility] OF [address] [place] [address] Phone: [phone] Fax: [phone]   Has the prescription been filled recently? No  Is the patient out of the medication? Yes  Has the patient been seen for an appointment in the last year OR does the patient have an upcoming appointment? Yes  Can we respond through MyChart? Yes  Agent: Please be advised that Rx refills may take up to 3 business days. We ask that you follow-up with your pharmacy.

## 2023-04-20 ENCOUNTER — Ambulatory Visit (INDEPENDENT_AMBULATORY_CARE_PROVIDER_SITE_OTHER): Payer: 59 | Admitting: Nurse Practitioner

## 2023-04-20 ENCOUNTER — Encounter: Payer: Self-pay | Admitting: Nurse Practitioner

## 2023-04-20 VITALS — BP 110/78 | HR 75 | Temp 97.5°F | Ht 73.0 in | Wt 251.0 lb

## 2023-04-20 DIAGNOSIS — Z125 Encounter for screening for malignant neoplasm of prostate: Secondary | ICD-10-CM | POA: Diagnosis not present

## 2023-04-20 DIAGNOSIS — Z0000 Encounter for general adult medical examination without abnormal findings: Secondary | ICD-10-CM | POA: Diagnosis not present

## 2023-04-20 DIAGNOSIS — E669 Obesity, unspecified: Secondary | ICD-10-CM | POA: Diagnosis not present

## 2023-04-20 DIAGNOSIS — E1169 Type 2 diabetes mellitus with other specified complication: Secondary | ICD-10-CM

## 2023-04-20 DIAGNOSIS — Z7984 Long term (current) use of oral hypoglycemic drugs: Secondary | ICD-10-CM | POA: Diagnosis not present

## 2023-04-20 DIAGNOSIS — Z720 Tobacco use: Secondary | ICD-10-CM

## 2023-04-20 DIAGNOSIS — Z6833 Body mass index (BMI) 33.0-33.9, adult: Secondary | ICD-10-CM

## 2023-04-20 DIAGNOSIS — E785 Hyperlipidemia, unspecified: Secondary | ICD-10-CM

## 2023-04-20 DIAGNOSIS — K7460 Unspecified cirrhosis of liver: Secondary | ICD-10-CM

## 2023-04-20 LAB — COMPREHENSIVE METABOLIC PANEL
ALT: 44 U/L (ref 0–53)
AST: 28 U/L (ref 0–37)
Albumin: 4.2 g/dL (ref 3.5–5.2)
Alkaline Phosphatase: 53 U/L (ref 39–117)
BUN: 19 mg/dL (ref 6–23)
CO2: 29 meq/L (ref 19–32)
Calcium: 9.5 mg/dL (ref 8.4–10.5)
Chloride: 105 meq/L (ref 96–112)
Creatinine, Ser: 0.94 mg/dL (ref 0.40–1.50)
GFR: 89.43 mL/min (ref >60.00)
Glucose, Bld: 116 mg/dL — ABNORMAL HIGH (ref 70–99)
Potassium: 4.7 meq/L (ref 3.5–5.1)
Sodium: 141 meq/L (ref 135–145)
Total Bilirubin: 0.9 mg/dL (ref 0.2–1.2)
Total Protein: 6.6 g/dL (ref 6.0–8.3)

## 2023-04-20 LAB — POCT GLYCOSYLATED HEMOGLOBIN (HGB A1C): Hemoglobin A1C: 5.6 % (ref 4.0–5.6)

## 2023-04-20 LAB — CBC
HCT: 48.7 % (ref 39.0–52.0)
Hemoglobin: 16.6 g/dL (ref 13.0–17.0)
MCHC: 34.1 g/dL (ref 30.0–36.0)
MCV: 92.2 fl (ref 78.0–100.0)
Platelets: 162 10*3/uL (ref 150.0–400.0)
RBC: 5.29 Mil/uL (ref 4.22–5.81)
RDW: 12.8 % (ref 11.5–15.5)
WBC: 5.4 10*3/uL (ref 4.0–10.5)

## 2023-04-20 LAB — TSH: TSH: 0.57 u[IU]/mL (ref 0.35–5.50)

## 2023-04-20 LAB — LIPID PANEL
Cholesterol: 116 mg/dL (ref 0–200)
HDL: 40.8 mg/dL (ref >39.00)
LDL Cholesterol: 49 mg/dL (ref 0–99)
NonHDL: 74.79
Total CHOL/HDL Ratio: 3
Triglycerides: 131 mg/dL (ref 0.0–149.0)
VLDL: 26.2 mg/dL (ref 0.0–40.0)

## 2023-04-20 LAB — MICROALBUMIN / CREATININE URINE RATIO
Creatinine,U: 245.6 mg/dL
Microalb Creat Ratio: 5.1 mg/g (ref 0.0–30.0)
Microalb, Ur: 1.2 mg/dL (ref 0.0–1.9)

## 2023-04-20 LAB — PSA: PSA: 0.62 ng/mL (ref 0.10–4.00)

## 2023-04-20 NOTE — Progress Notes (Signed)
 Established Patient Office Visit  Subjective   Patient ID: Robert Holland, male    DOB: 02/07/1966  Age: 57 y.o. MRN: [id number]  Chief Complaint  Patient presents with   Annual Exam    Has not been seen by eye doctor in over a year     HPI   DM2: currently maintained on metforin 1000mg  daily and jardiance 10mg  daily and ozempic 2mg  weekly. Did check until the holidays. No hypoglycemia  Liver: followed by [facility]  HLD: atrovastatin 10mg  daily   for complete physical and follow up of chronic conditions.  Immunizations: -Tetanus: Completed in 2019 -Influenza: 01/05/2023 -Shingles: Completed Shingrix series -Pneumonia: Completed   Diet: Fair diet. He is doing 3 meals a day and snacks sometimes he iwll snakc. He will do coffee, water, and 1/2 and 1/2 sweet tea Exercise: No regular exercise. He will walk   Eye exam: Needs updating.  Wears glasses Dental exam: Completes semi-annually    Colonoscopy: Completed in 2022, recall in 3 years  Lung Cancer Screening: N/A  PSA: Due  Sleep: goes to bed 730-9 and up around 430-6. Feels rested most times. Does not snore      Review of Systems  Constitutional:  Negative for chills and fever.  Respiratory:  Negative for shortness of breath.   Cardiovascular:  Negative for chest pain and leg swelling.  Gastrointestinal:  Negative for abdominal pain, blood in stool, constipation, diarrhea, nausea and vomiting.       BM daily   Genitourinary:  Negative for dysuria and hematuria.  Neurological:  Negative for dizziness, tingling and headaches.  Psychiatric/Behavioral:  Negative for hallucinations and suicidal ideas.       Objective:     BP 110/78   Pulse 75   Temp (!) 97.5 F (36.4 C) (Oral)   Ht 6\' 1"  (1.854 m)   Wt 251 lb (113.9 kg)   SpO2 98%   BMI 33.12 kg/m  BP Readings from Last 3 Encounters:  04/20/23 110/78  01/05/23 124/86  10/03/22 118/80   Wt Readings from Last 3 Encounters:  04/20/23 251 lb (113.9 kg)   01/05/23 253 lb (114.8 kg)  10/03/22 261 lb 9.6 oz (118.7 kg)   SpO2 Readings from Last 3 Encounters:  04/20/23 98%  01/05/23 97%  10/03/22 96%      Physical Exam Vitals and nursing note reviewed.  Constitutional:      Appearance: Normal appearance.  HENT:     Right Ear: Tympanic membrane, ear canal and external ear normal.     Left Ear: Tympanic membrane, ear canal and external ear normal.     Mouth/Throat:     Mouth: Mucous membranes are moist.     Pharynx: Oropharynx is clear.  Eyes:     Extraocular Movements: Extraocular movements intact.     Pupils: Pupils are equal, round, and reactive to light.  Cardiovascular:     Rate and Rhythm: Normal rate and regular rhythm.     Pulses: Normal pulses.     Heart sounds: Normal heart sounds.  Pulmonary:     Effort: Pulmonary effort is normal.     Breath sounds: Normal breath sounds.  Abdominal:     General: Bowel sounds are normal. There is no distension.     Palpations: There is no mass.     Tenderness: There is no abdominal tenderness.     Hernia: No hernia is present.  Musculoskeletal:     Right lower leg: No edema.  Left lower leg: No edema.  Lymphadenopathy:     Cervical: No cervical adenopathy.  Skin:    General: Skin is warm.  Neurological:     General: No focal deficit present.     Mental Status: He is alert.     Deep Tendon Reflexes:     Reflex Scores:      Bicep reflexes are 2+ on the right side and 2+ on the left side.      Patellar reflexes are 2+ on the right side and 2+ on the left side.    Comments: Bilateral upper and lower extremity strength 5/5  Psychiatric:        Mood and Affect: Mood normal.        Behavior: Behavior normal.        Thought Content: Thought content normal.        Judgment: Judgment normal.    Title   Diabetic Foot Exam - detailed Visual Foot Exam completed.: Yes  Is there a history of foot ulcer?: No Is there a foot ulcer now?: No Is there swelling?: No Is there  elevated skin temperature?: No Is there abnormal foot shape?: No Is there a claw toe deformity?: No Are the toenails long?: Yes Are the toenails thick?: No Are the toenails ingrown?: No Pulse Foot Exam completed.: Yes   Right Posterior Tibialis: Present Left posterior Tibialis: Present   Right Dorsalis Pedis: Present Left Dorsalis Pedis: Present     Sensory Foot Exam Completed.: Yes Semmes-Weinstein Monofilament Test "+" means "has sensation" and "-" means "no sensation"      Image components are not supported.   Image components are not supported. Image components are not supported.  Tuning Fork Comments All 10 sites sensation intact bilaterally       Results for orders placed or performed in visit on 04/20/23  POCT glycosylated hemoglobin (Hb A1C)  Result Value Ref Range   Hemoglobin A1C 5.6 4.0 - 5.6 %   HbA1c POC (<> result, manual entry)     HbA1c, POC (prediabetic range)     HbA1c, POC (controlled diabetic range)        The ASCVD Risk score (Arnett DK, et al., 2019) failed to calculate for the following reasons:   The valid total cholesterol range is 130 to 320 mg/dL    Assessment & Plan:   Problem List Items Addressed This Visit       Digestive   Cirrhosis of liver [facility])   Patient currently followed by [facility] gastroenterology.  Continue following specialist as recommended        Endocrine   Type 2 diabetes mellitus with other specified complication [facility])   Patient currently maintained on semaglutide 2 mg weekly, Jardiance 10 mg daily, metformin 1000 mg daily.  A1c well-controlled.  Continue work on healthy lifestyle modifications continue taking medication as prescribed      Relevant Orders   POCT glycosylated hemoglobin (Hb A1C) (Completed)   CBC   Comprehensive metabolic panel   Lipid panel   Microalbumin / creatinine urine ratio     Other   Obesity (BMI 30-39.9)   Pending TSH and lipid panel.  Continue work on healthy lifestyle modifications.   Patient has lost a couple pounds even over the holidays      Relevant Orders   TSH   Hyperlipidemia   Patient currently maintained on atorvastatin 10 mg daily.  Pending lipid panel.  Continue work on healthy lifestyle modifications.      Relevant  Orders   Lipid panel   Chews tobacco   Preventative health care - Primary   Discussed age-appropriate immunizations and screening exams.  Did review patient's personal, surgical, social, family histories.  Patient is up-to-date on all age-appropriate vaccinations he would like.  Patient is up-to-date on CRC screening.  PSA for prostate cancer screening today.  Patient was given information at discharge about preventative healthcare maintenance with anticipatory guidance      Relevant Orders   CBC   Comprehensive metabolic panel   TSH   Other Visit Diagnoses       Screening for prostate cancer       Relevant Orders   PSA       Return in about 6 months (around 10/21/2023) for DM recheck.    Audria Nine, NP

## 2023-04-20 NOTE — Assessment & Plan Note (Signed)
 Pending TSH and lipid panel.  Continue work on healthy lifestyle modifications.  Patient has lost a couple pounds even over the holidays

## 2023-04-20 NOTE — Assessment & Plan Note (Signed)
 Patient currently maintained on semaglutide 2 mg weekly, Jardiance 10 mg daily, metformin 1000 mg daily.  A1c well-controlled.  Continue work on healthy lifestyle modifications continue taking medication as prescribed

## 2023-04-20 NOTE — Assessment & Plan Note (Signed)
 Discussed age-appropriate immunizations and screening exams.  Did review patient's personal, surgical, social, family histories.  Patient is up-to-date on all age-appropriate vaccinations he would like.  Patient is up-to-date on CRC screening.  PSA for prostate cancer screening today.  Patient was given information at discharge about preventative healthcare maintenance with anticipatory guidance.

## 2023-04-20 NOTE — Patient Instructions (Signed)
Nice to see you today I will be in touch with the labs once I have reviewed them Follow up with me in 6 months, sooner if you need me

## 2023-04-20 NOTE — Assessment & Plan Note (Signed)
 Patient currently maintained on atorvastatin 10 mg daily.  Pending lipid panel.  Continue work on healthy lifestyle modifications.

## 2023-04-20 NOTE — Assessment & Plan Note (Signed)
 Patient currently followed by [facility] gastroenterology.  Continue following specialist as recommended

## 2023-04-22 ENCOUNTER — Encounter: Payer: Self-pay | Admitting: Nurse Practitioner

## 2023-05-28 ENCOUNTER — Other Ambulatory Visit: Payer: Self-pay | Admitting: Nurse Practitioner

## 2023-05-28 DIAGNOSIS — E1169 Type 2 diabetes mellitus with other specified complication: Secondary | ICD-10-CM

## 2023-07-21 ENCOUNTER — Other Ambulatory Visit: Payer: Self-pay | Admitting: Nurse Practitioner

## 2023-07-21 DIAGNOSIS — E1169 Type 2 diabetes mellitus with other specified complication: Secondary | ICD-10-CM

## 2023-08-06 ENCOUNTER — Other Ambulatory Visit: Payer: Self-pay | Admitting: Nurse Practitioner

## 2023-08-06 DIAGNOSIS — E1169 Type 2 diabetes mellitus with other specified complication: Secondary | ICD-10-CM

## 2023-08-06 NOTE — Telephone Encounter (Signed)
 Copied from CRM [id number]. Topic: Clinical - Medication Refill >> Aug 06, 2023 10:35 AM Adonis Hoot wrote: Medication: Semaglutide , 2 MG/DOSE, (OZEMPIC , 2 MG/DOSE,) 8 MG/3ML SOPN  Has the patient contacted their pharmacy? Yes (Agent: If no, request that the patient contact the pharmacy for the refill. If patient does not wish to contact the pharmacy document the reason why and proceed with request.) (Agent: If yes, when and what did the pharmacy advise?)  This is the patient's preferred pharmacy:  [id number] - [place], [place] - [address] AT [facility] [address] [place] [address] Phone: [phone] Fax: [phone]    Is this the correct pharmacy for this prescription? Yes If no, delete pharmacy and type the correct one.   Has the prescription been filled recently? No  Is the patient out of the medication? Yes  Has the patient been seen for an appointment in the last year OR does the patient have an upcoming appointment? Yes  Can we respond through MyChart? Yes  Agent: Please be advised that Rx refills may take up to 3 business days. We ask that you follow-up with your pharmacy.

## 2023-08-07 MED ORDER — OZEMPIC (2 MG/DOSE) 8 MG/3ML ~~LOC~~ SOPN: 2.0000 mg | PEN_INJECTOR | SUBCUTANEOUS | 1 refills | Status: DC

## 2023-08-22 ENCOUNTER — Encounter (HOSPITAL_COMMUNITY): Payer: Self-pay

## 2023-08-22 ENCOUNTER — Emergency Department (HOSPITAL_COMMUNITY)
Admission: EM | Admit: 2023-08-22 | Discharge: 2023-08-22 | Disposition: A | Discharge status: Home / Self Care | Source: Intra-hospital | Attending: Emergency Medicine | Admitting: Emergency Medicine

## 2023-08-22 ENCOUNTER — Emergency Department (HOSPITAL_COMMUNITY)

## 2023-08-22 ENCOUNTER — Other Ambulatory Visit: Payer: Self-pay

## 2023-08-22 DIAGNOSIS — Z7984 Long term (current) use of oral hypoglycemic drugs: Secondary | ICD-10-CM | POA: Diagnosis not present

## 2023-08-22 DIAGNOSIS — S3991XA Unspecified injury of abdomen, initial encounter: Secondary | ICD-10-CM | POA: Diagnosis not present

## 2023-08-22 DIAGNOSIS — Z794 Long term (current) use of insulin: Secondary | ICD-10-CM | POA: Insufficient documentation

## 2023-08-22 DIAGNOSIS — W28XXXA Contact with powered lawn mower, initial encounter: Secondary | ICD-10-CM | POA: Insufficient documentation

## 2023-08-22 DIAGNOSIS — S80811A Abrasion, right lower leg, initial encounter: Secondary | ICD-10-CM | POA: Insufficient documentation

## 2023-08-22 DIAGNOSIS — S0081XA Abrasion of other part of head, initial encounter: Secondary | ICD-10-CM | POA: Diagnosis not present

## 2023-08-22 DIAGNOSIS — W309XXA Contact with unspecified agricultural machinery, initial encounter: Secondary | ICD-10-CM

## 2023-08-22 DIAGNOSIS — Y9389 Activity, other specified: Secondary | ICD-10-CM | POA: Insufficient documentation

## 2023-08-22 DIAGNOSIS — S00531A Contusion of lip, initial encounter: Secondary | ICD-10-CM | POA: Diagnosis not present

## 2023-08-22 DIAGNOSIS — E119 Type 2 diabetes mellitus without complications: Secondary | ICD-10-CM | POA: Insufficient documentation

## 2023-08-22 DIAGNOSIS — R0789 Other chest pain: Secondary | ICD-10-CM | POA: Diagnosis not present

## 2023-08-22 DIAGNOSIS — S32029A Unspecified fracture of second lumbar vertebra, initial encounter for closed fracture: Secondary | ICD-10-CM | POA: Diagnosis not present

## 2023-08-22 DIAGNOSIS — S0990XA Unspecified injury of head, initial encounter: Secondary | ICD-10-CM | POA: Diagnosis not present

## 2023-08-22 DIAGNOSIS — Z23 Encounter for immunization: Secondary | ICD-10-CM | POA: Diagnosis not present

## 2023-08-22 DIAGNOSIS — S3992XA Unspecified injury of lower back, initial encounter: Secondary | ICD-10-CM | POA: Diagnosis present

## 2023-08-22 LAB — COMPREHENSIVE METABOLIC PANEL WITH GFR
ALT: 46 U/L — ABNORMAL HIGH (ref 0–44)
AST: 40 U/L (ref 15–41)
Albumin: 4 g/dL (ref 3.5–5.0)
Alkaline Phosphatase: 55 U/L (ref 38–126)
Anion gap: 13 (ref 5–15)
BUN: 18 mg/dL (ref 6–20)
CO2: 22 mmol/L (ref 22–32)
Calcium: 9.3 mg/dL (ref 8.9–10.3)
Chloride: 103 mmol/L (ref 98–111)
Creatinine, Ser: 1.04 mg/dL (ref 0.61–1.24)
GFR, Estimated: >60 mL/min (ref >60)
Glucose, Bld: 136 mg/dL — ABNORMAL HIGH (ref 70–99)
Potassium: 3.9 mmol/L (ref 3.5–5.1)
Sodium: 138 mmol/L (ref 135–145)
Total Bilirubin: 1.5 mg/dL — ABNORMAL HIGH (ref 0.0–1.2)
Total Protein: 6.7 g/dL (ref 6.5–8.1)

## 2023-08-22 LAB — I-STAT CHEM 8, ED
BUN: 19 mg/dL (ref 6–20)
Calcium, Ion: 1.09 mmol/L — ABNORMAL LOW (ref 1.15–1.40)
Chloride: 104 mmol/L (ref 98–111)
Creatinine, Ser: 0.9 mg/dL (ref 0.61–1.24)
Glucose, Bld: 133 mg/dL — ABNORMAL HIGH (ref 70–99)
HCT: 47 % (ref 39.0–52.0)
Hemoglobin: 16 g/dL (ref 13.0–17.0)
Potassium: 3.7 mmol/L (ref 3.5–5.1)
Sodium: 140 mmol/L (ref 135–145)
TCO2: 24 mmol/L (ref 22–32)

## 2023-08-22 LAB — CBC
HCT: 47.8 % (ref 39.0–52.0)
Hemoglobin: 17 g/dL (ref 13.0–17.0)
MCH: 31.4 pg (ref 26.0–34.0)
MCHC: 35.6 g/dL (ref 30.0–36.0)
MCV: 88.2 fL (ref 80.0–100.0)
Platelets: 179 K/uL (ref 150–400)
RBC: 5.42 MIL/uL (ref 4.22–5.81)
RDW: 12.2 % (ref 11.5–15.5)
WBC: 20.4 K/uL — ABNORMAL HIGH (ref 4.0–10.5)
nRBC: 0 % (ref 0.0–0.2)

## 2023-08-22 LAB — URINALYSIS, ROUTINE W REFLEX MICROSCOPIC
Bacteria, UA: NONE SEEN
Bilirubin Urine: NEGATIVE
Glucose, UA: >=500 mg/dL — AB
Hgb urine dipstick: NEGATIVE
Ketones, ur: 20 mg/dL — AB
Leukocytes,Ua: NEGATIVE
Nitrite: NEGATIVE
Protein, ur: NEGATIVE mg/dL
Specific Gravity, Urine: 1.029 (ref 1.005–1.030)
pH: 5 (ref 5.0–8.0)

## 2023-08-22 LAB — PROTIME-INR
INR: 1.1 (ref 0.8–1.2)
Prothrombin Time: 15.3 s — ABNORMAL HIGH (ref 11.4–15.2)

## 2023-08-22 LAB — I-STAT CG4 LACTIC ACID, ED: Lactic Acid, Venous: 2.7 mmol/L (ref 0.5–1.9)

## 2023-08-22 LAB — SAMPLE TO BLOOD BANK

## 2023-08-22 LAB — TROPONIN I (HIGH SENSITIVITY): Troponin I (High Sensitivity): <2 ng/L (ref <18)

## 2023-08-22 LAB — CBG MONITORING, ED: Glucose-Capillary: 110 mg/dL — ABNORMAL HIGH (ref 70–99)

## 2023-08-22 LAB — ETHANOL: Alcohol, Ethyl (B): <15 mg/dL (ref <15)

## 2023-08-22 MED ORDER — TETANUS-DIPHTH-ACELL PERTUSSIS 5-2.5-18.5 LF-MCG/0.5 IM SUSY
0.5000 mL | PREFILLED_SYRINGE | Freq: Once | INTRAMUSCULAR | Status: AC
  Administered 2023-08-22: 0.5 mL via INTRAMUSCULAR
  Filled 2023-08-22: qty 0.5

## 2023-08-22 MED ORDER — IOHEXOL 350 MG/ML SOLN
75.0000 mL | Freq: Once | INTRAVENOUS | Status: AC | PRN
  Administered 2023-08-22: 75 mL via INTRAVENOUS

## 2023-08-22 MED ORDER — SODIUM CHLORIDE 0.9 % IV BOLUS
500.0000 mL | Freq: Once | INTRAVENOUS | Status: AC
Start: 2023-08-22 — End: 2023-08-22
  Administered 2023-08-22: 500 mL via INTRAVENOUS

## 2023-08-22 MED ORDER — FENTANYL CITRATE PF 50 MCG/ML IJ SOSY
50.0000 ug | PREFILLED_SYRINGE | Freq: Once | INTRAMUSCULAR | Status: AC
  Administered 2023-08-22: 50 ug via INTRAVENOUS
  Filled 2023-08-22: qty 1

## 2023-08-22 MED ORDER — OXYCODONE HCL 5 MG PO TABS: 5.0000 mg | ORAL_TABLET | Freq: Four times a day (QID) | ORAL | 0 refills | Status: DC | PRN

## 2023-08-22 MED ORDER — HYDROMORPHONE HCL 1 MG/ML IJ SOLN
1.0000 mg | Freq: Once | INTRAMUSCULAR | Status: AC
  Administered 2023-08-22: 1 mg via INTRAVENOUS
  Filled 2023-08-22: qty 1

## 2023-08-22 MED ORDER — IOHEXOL 350 MG/ML SOLN
100.0000 mL | Freq: Once | INTRAVENOUS | Status: AC | PRN
Start: 2023-08-22 — End: 2023-08-22
  Administered 2023-08-22: 100 mL via INTRAVENOUS
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
## 2023-08-22 NOTE — ED Notes (Signed)
 Patient transported to CT with RN

## 2023-08-22 NOTE — Progress Notes (Signed)
 Orthopedic Tech Progress Note Patient Details:  Robert Holland 09/02/1965 [id number]  Ortho Devices Type of Ortho Device: Lumbar corsett Ortho Device/Splint Interventions: Ordered, Application, Adjustment   Post Interventions Patient Tolerated: Well Instructions Provided: Care of device, Adjustment of device  Chandra Dorn PARAS 08/22/2023, 8:20 PM

## 2023-08-22 NOTE — Discharge Instructions (Addendum)
 Take the oxycodone  as directed.  Wear the brace at all times when ambulating.  Return for any new or worse symptoms.  Expect to be sore and stiff over the next few days.  With this pain medication can also take extra strength Tylenol  2 tablets every 8 hours.  Make an appointment to follow-up with orthopedics.  Information provided above.
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
## 2023-08-22 NOTE — ED Notes (Signed)
 Pt ambulated post brace administration, pt tolerated well with minimal assistance, Ortho tech explained how to use device, pt verbalized understanding, teach back method used when explaining to wife.
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
## 2023-08-31 ENCOUNTER — Ambulatory Visit: Payer: Self-pay
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
## 2023-08-31 NOTE — Telephone Encounter (Signed)
 Routed to [facility] in error; PCP not in this office

## 2023-09-01 ENCOUNTER — Emergency Department (HOSPITAL_COMMUNITY)

## 2023-09-01 ENCOUNTER — Emergency Department (HOSPITAL_COMMUNITY)
Admission: EM | Admit: 2023-09-01 | Discharge: 2023-09-01 | Disposition: A | Discharge status: Home / Self Care | Attending: Emergency Medicine | Admitting: Emergency Medicine

## 2023-09-01 ENCOUNTER — Other Ambulatory Visit: Payer: Self-pay

## 2023-09-01 DIAGNOSIS — K5903 Drug induced constipation: Secondary | ICD-10-CM | POA: Diagnosis not present

## 2023-09-01 DIAGNOSIS — E119 Type 2 diabetes mellitus without complications: Secondary | ICD-10-CM | POA: Diagnosis not present

## 2023-09-01 DIAGNOSIS — K5900 Constipation, unspecified: Secondary | ICD-10-CM | POA: Diagnosis present

## 2023-09-01 LAB — COMPREHENSIVE METABOLIC PANEL WITH GFR
ALT: 40 U/L (ref 0–44)
AST: 35 U/L (ref 15–41)
Albumin: 4.1 g/dL (ref 3.5–5.0)
Alkaline Phosphatase: 89 U/L (ref 38–126)
Anion gap: 11 (ref 5–15)
BUN: 11 mg/dL (ref 6–20)
CO2: 24 mmol/L (ref 22–32)
Calcium: 9.7 mg/dL (ref 8.9–10.3)
Chloride: 100 mmol/L (ref 98–111)
Creatinine, Ser: 0.99 mg/dL (ref 0.61–1.24)
GFR, Estimated: >60 mL/min (ref >60)
Glucose, Bld: 148 mg/dL — ABNORMAL HIGH (ref 70–99)
Potassium: 4.2 mmol/L (ref 3.5–5.1)
Sodium: 135 mmol/L (ref 135–145)
Total Bilirubin: 1.8 mg/dL — ABNORMAL HIGH (ref 0.0–1.2)
Total Protein: 7.3 g/dL (ref 6.5–8.1)

## 2023-09-01 LAB — CBC WITH DIFFERENTIAL/PLATELET
Abs Immature Granulocytes: 0.02 K/uL (ref 0.00–0.07)
Basophils Absolute: 0.1 K/uL (ref 0.0–0.1)
Basophils Relative: 1 %
Eosinophils Absolute: 0.3 K/uL (ref 0.0–0.5)
Eosinophils Relative: 3 %
HCT: 50.3 % (ref 39.0–52.0)
Hemoglobin: 18 g/dL — ABNORMAL HIGH (ref 13.0–17.0)
Immature Granulocytes: 0 %
Lymphocytes Relative: 21 %
Lymphs Abs: 1.8 K/uL (ref 0.7–4.0)
MCH: 32.2 pg (ref 26.0–34.0)
MCHC: 35.8 g/dL (ref 30.0–36.0)
MCV: 90 fL (ref 80.0–100.0)
Monocytes Absolute: 0.7 K/uL (ref 0.1–1.0)
Monocytes Relative: 8 %
Neutro Abs: 5.8 K/uL (ref 1.7–7.7)
Neutrophils Relative %: 67 %
Platelets: 247 K/uL (ref 150–400)
RBC: 5.59 MIL/uL (ref 4.22–5.81)
RDW: 12.5 % (ref 11.5–15.5)
WBC: 8.6 K/uL (ref 4.0–10.5)
nRBC: 0 % (ref 0.0–0.2)

## 2023-09-01 LAB — URINALYSIS, ROUTINE W REFLEX MICROSCOPIC
Glucose, UA: NEGATIVE mg/dL
Hgb urine dipstick: NEGATIVE
Ketones, ur: 5 mg/dL — AB
Leukocytes,Ua: NEGATIVE
Nitrite: NEGATIVE
Protein, ur: NEGATIVE mg/dL
Specific Gravity, Urine: 1.023 (ref 1.005–1.030)
pH: 5 (ref 5.0–8.0)

## 2023-09-01 MED ORDER — LACTULOSE 10 GM/15ML PO SOLN: 20.0000 g | Freq: Two times a day (BID) | ORAL | 0 refills | Status: DC | PRN

## 2023-09-01 MED ORDER — SENNOSIDES-DOCUSATE SODIUM 8.6-50 MG PO TABS: 1.0000 | ORAL_TABLET | Freq: Every evening | ORAL | 0 refills | Status: DC | PRN

## 2023-09-01 NOTE — ED Provider Notes (Signed)
 Emergency Department Provider Note   I have reviewed the triage vital signs and the nursing notes.   HISTORY  Chief Complaint Constipation   HPI Robert Holland is a 58 y.o. male past history of diabetes and back pain on opiate medications presents to the emergency department with constipation.  He estimates his last good bowel movement was July 4.  Denies abdominal or rectal pain.  He is passing flatus.  No nausea or vomiting.  He is tried multiple over-the-counter remedies for constipation including laxatives, stool softeners, suppositories, enema without relief.  Past Medical History:  Diagnosis Date   Back pain    Diabetes mellitus without complication (HCC)    Genital warts 2017   Hyperlipidemia    Joint pain     Review of Systems  Constitutional: No fever/chills Cardiovascular: Denies chest pain. Respiratory: Denies shortness of breath. Gastrointestinal: No abdominal pain.  No nausea, no vomiting.  No diarrhea. Positive constipation. Skin: Negative for rash. Neurological: Negative for headaches.  ____________________________________________   PHYSICAL EXAM:  VITAL SIGNS: ED Triage Vitals  Encounter Vitals Group     BP 09/01/23 0551 (!) 155/105     Pulse Rate 09/01/23 0551 (!) 105     Resp 09/01/23 0551 18     Temp 09/01/23 0551 97.8 F (36.6 C)     Temp Source 09/01/23 0955 Oral     SpO2 09/01/23 0551 98 %   Constitutional: Alert and oriented. Well appearing and in no acute distress. Eyes: Conjunctivae are normal.  Head: Atraumatic. Nose: No congestion/rhinnorhea. Mouth/Throat: Mucous membranes are moist.  Neck: No stridor.   Cardiovascular: Good peripheral circulation.  Respiratory: Normal respiratory effort.  Gastrointestinal: Soft and nontender. No distention.  Musculoskeletal:  No gross deformities of extremities. Neurologic:  Normal speech and language.  Skin:  Skin is warm, dry and intact. No rash  noted.  ____________________________________________   LABS (all labs ordered are listed, but only abnormal results are displayed)  Labs Reviewed  CBC WITH DIFFERENTIAL/PLATELET - Abnormal; Notable for the following components:      Result Value   Hemoglobin 18.0 (*)    All other components within normal limits  COMPREHENSIVE METABOLIC PANEL WITH GFR - Abnormal; Notable for the following components:   Glucose, Bld 148 (*)    Total Bilirubin 1.8 (*)    All other components within normal limits  URINALYSIS, ROUTINE W REFLEX MICROSCOPIC - Abnormal; Notable for the following components:   Color, Urine AMBER (*)    Bilirubin Urine SMALL (*)    Ketones, ur 5 (*)    All other components within normal limits   ____________________________________________  RADIOLOGY  DG Abdomen Acute W/Chest Result Date: 09/01/2023 CLINICAL DATA:  constipation EXAM: DG ABDOMEN ACUTE WITH 1 VIEW CHEST COMPARISON:  August 22, 2023 FINDINGS: Nonobstructive bowel gas pattern. Moderate volume fecal loading throughout the right hemicolon. No pneumoperitoneum. No organomegaly or radiopaque calculi. No acute fracture or destructive lesion. No cardiomegaly.No focal airspace consolidation, pleural effusion, or pneumothorax. Multilevel thoracolumbar osteophytosis. IMPRESSION: 1. Nonobstructive bowel gas pattern. Moderate volume fecal loading throughout the right hemicolon, as can be seen in constipation. 2. Clear lungs. Electronically Signed   By: Rogelia Myers M.D.   On: 09/01/2023 10:54    ____________________________________________   PROCEDURES  Procedure(s) performed:   Procedures  None  ____________________________________________   INITIAL IMPRESSION / ASSESSMENT AND PLAN / ED COURSE  Pertinent labs & imaging results that were available during my care of the patient were reviewed by me  and considered in my medical decision making (see chart for details).   This patient is Presenting for Evaluation of  constipation, which does require a range of treatment options, and is a complaint that involves a moderate risk of morbidity and mortality.  The Differential Diagnoses include drug-induced constipation, dehydration, fecal impaction, SBO, etc.  Clinical Laboratory Tests Ordered, included CBC without leukocytosis.  No urinary tract infection.  No kidney injury.  LFTs normal.  Radiologic Tests Ordered, included abdomen XR. I independently interpreted the images and agree with radiology interpretation.   Social Determinants of Health Risk patient has a smoking history.   Medical Decision Making: Summary:  The patient presents emergency department with constipation likely related to starting oxycodone  in the setting of a back injury.  Abdomen is diffusely soft with no peritonitis.  No severe rectal pain to clinically suggest fecal impaction.  Plan for acute abdomen series to rule out obstructive bowel gas pattern.   Reevaluation with update and discussion with patient and wife at bedside.  X-ray shows no evidence of fecal impaction or bowel obstruction.  We discussed increasing his bowel regimen.  I am adding lactulose  and Peri-Colace along with continued MiraLAX as needed.  Considered Linzess but will need preapproval from his PCP/insurance due to high cost. He will call the office today to discuss further.   Patient's presentation is most consistent with acute presentation with potential threat to life or bodily function.   Disposition: discharge  ____________________________________________  FINAL CLINICAL IMPRESSION(S) / ED DIAGNOSES  Final diagnoses:  Drug-induced constipation     NEW OUTPATIENT MEDICATIONS STARTED DURING THIS VISIT:  New Prescriptions   LACTULOSE  (CHRONULAC ) 10 GM/15ML SOLUTION    Take 30 mLs (20 g total) by mouth 2 (two) times daily as needed for mild constipation or moderate constipation.   SENNA-DOCUSATE (SENOKOT-S) 8.6-50 MG TABLET    Take 1 tablet by mouth at  bedtime as needed for mild constipation.    Note:  This document was prepared using Dragon voice recognition software and may include unintentional dictation errors.  Fonda Law, MD, [facility]    Rosina Cressler, Fonda MATSU, MD 09/01/23 [phone]

## 2023-09-01 NOTE — ED Triage Notes (Signed)
 Patient reports constipation for several days unrelieved by OTC laxative and stool softener . Denies emesis or diarrhea.

## 2023-09-01 NOTE — Discharge Instructions (Addendum)
 You were seen emerged from today with constipation.  You have fairly significant constipation on your x-ray but no blockage or fecal impaction.  I am starting you on a more aggressive bowel regimen at home.  He should discuss further with your primary care doctor as there are certain medicines for opiate constipation that can be useful if these options do not work.  If you develop severe abdominal pain, vomiting, fevers you should return to the emergency department.

## 2023-10-21 ENCOUNTER — Ambulatory Visit: Admitting: Nurse Practitioner

## 2023-10-21 VITALS — BP 112/76 | HR 85 | Temp 98.0°F | Ht 73.0 in | Wt 226.4 lb

## 2023-10-21 DIAGNOSIS — E1169 Type 2 diabetes mellitus with other specified complication: Secondary | ICD-10-CM | POA: Diagnosis not present

## 2023-10-21 DIAGNOSIS — Z7984 Long term (current) use of oral hypoglycemic drugs: Secondary | ICD-10-CM

## 2023-10-21 DIAGNOSIS — M899 Disorder of bone, unspecified: Secondary | ICD-10-CM | POA: Insufficient documentation

## 2023-10-21 DIAGNOSIS — Z23 Encounter for immunization: Secondary | ICD-10-CM | POA: Diagnosis not present

## 2023-10-21 DIAGNOSIS — Z7985 Long-term (current) use of injectable non-insulin antidiabetic drugs: Secondary | ICD-10-CM

## 2023-10-21 LAB — PSA: PSA: 0.75 ng/mL (ref 0.10–4.00)

## 2023-10-21 LAB — POCT GLYCOSYLATED HEMOGLOBIN (HGB A1C): Hemoglobin A1C: 5.2 % (ref 4.0–5.6)

## 2023-10-21 MED ORDER — METFORMIN HCL ER 500 MG PO TB24: 500.0000 mg | ORAL_TABLET | Freq: Every day | ORAL | Status: DC
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
## 2023-10-21 NOTE — Patient Instructions (Signed)
 Nice to see you today  I have decreased the metformin  to 1 tablet daily Follow up with me in 4 months, sooner If you need me

## 2023-10-22 ENCOUNTER — Ambulatory Visit: Payer: Self-pay | Admitting: Nurse Practitioner

## 2023-11-05 ENCOUNTER — Other Ambulatory Visit: Payer: Self-pay | Admitting: Nurse Practitioner

## 2023-11-05 ENCOUNTER — Ambulatory Visit: Admitting: Nurse Practitioner

## 2023-11-05 VITALS — BP 122/80 | HR 82 | Temp 98.4°F | Ht 73.0 in | Wt 223.4 lb

## 2023-11-05 DIAGNOSIS — R197 Diarrhea, unspecified: Secondary | ICD-10-CM

## 2023-11-05 DIAGNOSIS — E785 Hyperlipidemia, unspecified: Secondary | ICD-10-CM | POA: Diagnosis not present

## 2023-11-05 DIAGNOSIS — E1169 Type 2 diabetes mellitus with other specified complication: Secondary | ICD-10-CM

## 2023-11-05 DIAGNOSIS — R112 Nausea with vomiting, unspecified: Secondary | ICD-10-CM

## 2023-11-05 LAB — CBC
HCT: 51.2 % (ref 39.0–52.0)
Hemoglobin: 17.4 g/dL — ABNORMAL HIGH (ref 13.0–17.0)
MCHC: 33.9 g/dL (ref 30.0–36.0)
MCV: 90.6 fl (ref 78.0–100.0)
Platelets: 210 K/uL (ref 150.0–400.0)
RBC: 5.65 Mil/uL (ref 4.22–5.81)
RDW: 13 % (ref 11.5–15.5)
WBC: 7.8 K/uL (ref 4.0–10.5)

## 2023-11-05 LAB — COMPREHENSIVE METABOLIC PANEL WITH GFR
ALT: 50 U/L (ref 0–53)
AST: 38 U/L — ABNORMAL HIGH (ref 0–37)
Albumin: 4.5 g/dL (ref 3.5–5.2)
Alkaline Phosphatase: 67 U/L (ref 39–117)
BUN: 17 mg/dL (ref 6–23)
CO2: 29 meq/L (ref 19–32)
Calcium: 9.9 mg/dL (ref 8.4–10.5)
Chloride: 101 meq/L (ref 96–112)
Creatinine, Ser: 1.04 mg/dL (ref 0.40–1.50)
GFR: 78.91 mL/min (ref >60.00)
Glucose, Bld: 97 mg/dL (ref 70–99)
Potassium: 4.2 meq/L (ref 3.5–5.1)
Sodium: 138 meq/L (ref 135–145)
Total Bilirubin: 1.2 mg/dL (ref 0.2–1.2)
Total Protein: 7 g/dL (ref 6.0–8.3)

## 2023-11-05 LAB — LIPASE: Lipase: 162 U/L — ABNORMAL HIGH (ref 11.0–59.0)

## 2023-11-05 MED ORDER — METFORMIN HCL ER 500 MG PO TB24: 500.0000 mg | ORAL_TABLET | Freq: Every day | ORAL | 1 refills | Status: AC

## 2023-11-05 MED ORDER — ATORVASTATIN CALCIUM 10 MG PO TABS: 10.0000 mg | ORAL_TABLET | Freq: Every day | ORAL | 3 refills | Status: DC

## 2023-11-05 MED ORDER — EMPAGLIFLOZIN 10 MG PO TABS: 10.0000 mg | ORAL_TABLET | Freq: Every day | ORAL | 1 refills | Status: AC

## 2023-11-05 MED ORDER — OMEPRAZOLE 20 MG PO CPDR: 20.0000 mg | DELAYED_RELEASE_CAPSULE | Freq: Every day | ORAL | 0 refills | Status: DC

## 2023-11-05 NOTE — Progress Notes (Signed)
 Acute Office Visit  Subjective:     Patient ID: Robert Holland, male    DOB: 1965-06-19, 59 y.o.   MRN: [id number]  Chief Complaint  Patient presents with   sulfur burp, nausea and vomitting, and diarrhea    Pt complains of vomiting and diarrhea that started on Monday. Pt states of a egg-like sulfur burp     Medication Management    Pt requests for all prescriptions be sent to the [facility] on [address]    HPI Patient is in today for nausea/vomiting/diarrhea with a histoyr of  Cirrhosis, DM2, splenomegaly He is on semaglutide  2mg  weekly   Discussed the use of AI scribe software for clinical note transcription with the patient, who gave verbal consent to proceed.  History of Present Illness Robert Holland is a 58 year old male who presents with nausea, vomiting, and diarrhea. He is accompanied by his wife.  He began experiencing nausea, vomiting, and diarrhea on Monday night, with symptoms persisting through Tuesday night. By Wednesday night, he felt better and scheduled an appointment. The vomiting was severe, with dry heaves, and he estimates having four to five episodes of diarrhea. Initially, diarrhea was present, but it subsided, leaving only vomiting and dry heaves.  He describes having sulfur burps, which he finds 'awful, like rotten egg,' and his wife observed him burping in his sleep. No fever or chills were present, and he was the only one in the household to fall ill.  He takes Ozempic  injections on Saturdays and has been on a 2 mg dose for a while. He did not take his Ozempic  this past Saturday because he forgot. He does not recall any correlation between the timing of his Ozempic  injections and the onset of symptoms in the past. He also takes metformin  once daily, Jardiance , and atorvastatin .  He has been eating bland foods like grits and toast and drinking water and PowerAid zero sugar. He notes that water sometimes worsens his symptoms. He denies any significant  abdominal pain but mentions a slight nauseated feeling when pressure is applied to his abdomen.  No fever, chills, or significant abdominal pain. He does not check his blood sugar regularly but does not feel he had any issues with sugar levels during the illness.  Review of Systems  Constitutional:  Negative for chills and fever.  Respiratory:  Negative for shortness of breath.   Cardiovascular:  Negative for chest pain.  Gastrointestinal:  Positive for diarrhea, nausea and vomiting. Negative for abdominal pain.        Objective:    BP 122/80   Pulse 82   Temp 98.4 F (36.9 C) (Oral)   Ht 6' 1 (1.854 m)   Wt 223 lb 6.4 oz (101.3 kg)   SpO2 96%   BMI 29.47 kg/m    Physical Exam Vitals and nursing note reviewed.  Constitutional:      Appearance: Normal appearance.  Cardiovascular:     Rate and Rhythm: Normal rate and regular rhythm.     Heart sounds: Normal heart sounds.  Pulmonary:     Effort: Pulmonary effort is normal.     Breath sounds: Normal breath sounds.  Abdominal:     General: Bowel sounds are normal. There is no distension.     Palpations: There is no mass.     Tenderness: There is no abdominal tenderness.     Hernia: No hernia is present.  Neurological:     Mental Status: He is alert.  No results found for any visits on 11/05/23.      Assessment & Plan:   Problem List Items Addressed This Visit       Endocrine   Type 2 diabetes mellitus with other specified complication (HCC)   Relevant Medications   atorvastatin  (LIPITOR) 10 MG tablet   empagliflozin  (JARDIANCE ) 10 MG TABS tablet   metFORMIN  (GLUCOPHAGE -XR) 500 MG 24 hr tablet     Other   Hyperlipidemia   Relevant Medications   atorvastatin  (LIPITOR) 10 MG tablet   Other Visit Diagnoses       Nausea vomiting and diarrhea    -  Primary   Relevant Medications   omeprazole  (PRILOSEC) 20 MG capsule   Other Relevant Orders   CBC   Comprehensive metabolic panel with GFR   Lipase       Assessment and Plan Assessment & Plan Type 2 diabetes mellitus with gastrointestinal complication due to GLP-1 agonist therapy Nausea, vomiting, diarrhea, and sulfur burps likely due to Ozempic  (semaglutide ). Symptoms resolved, no fever, chills, or abdominal pain. Pancreatitis less likely without severe abdominal pain. Viral infection less likely. - Order blood tests including lipase to rule out pancreatitis. - Prescribe omeprazole  once daily for 30 days. - Instruct to monitor symptoms and report recurrence. - Transfer all medications to [facility].  Meds ordered this encounter  Medications   atorvastatin  (LIPITOR) 10 MG tablet    Sig: Take 1 tablet (10 mg total) by mouth daily.    Dispense:  90 tablet    Refill:  3    Supervising Provider:   RANDEEN HARDY A [[id number]]   empagliflozin  (JARDIANCE ) 10 MG TABS tablet    Sig: Take 1 tablet (10 mg total) by mouth daily before breakfast.    Dispense:  90 tablet    Refill:  1    PT OUT OF REFILLS    Supervising Provider:   RANDEEN HARDY A [[id number]]   metFORMIN  (GLUCOPHAGE -XR) 500 MG 24 hr tablet    Sig: Take 1 tablet (500 mg total) by mouth daily with breakfast.    Dispense:  90 tablet    Refill:  1    PT IS REQUESTING REFILL    Supervising Provider:   RANDEEN HARDY A [[id number]]   omeprazole  (PRILOSEC) 20 MG capsule    Sig: Take 1 capsule (20 mg total) by mouth daily.    Dispense:  30 capsule    Refill:  0    Supervising Provider:   RANDEEN HARDY A [[id number]]    Return if symptoms worsen or fail to improve, for As scheduled .  Adina Crandall, NP

## 2023-11-05 NOTE — Patient Instructions (Signed)
 Nice to see you today  I will be in touch with the labs once I have reviewed them Follow up as scheduled, sooner if you need me   I have sent the omeprazole  to the pharmacy

## 2023-11-06 ENCOUNTER — Ambulatory Visit: Payer: Self-pay | Admitting: Nurse Practitioner

## 2023-11-09 ENCOUNTER — Other Ambulatory Visit: Payer: Self-pay | Admitting: Nurse Practitioner

## 2023-11-09 DIAGNOSIS — E785 Hyperlipidemia, unspecified: Secondary | ICD-10-CM

## 2023-12-16 ENCOUNTER — Other Ambulatory Visit: Payer: Self-pay | Admitting: Nurse Practitioner

## 2023-12-16 DIAGNOSIS — R112 Nausea with vomiting, unspecified: Secondary | ICD-10-CM

## 2023-12-31 ENCOUNTER — Ambulatory Visit: Admitting: Certified Registered"

## 2023-12-31 ENCOUNTER — Ambulatory Visit
Admission: RE | Admit: 2023-12-31 | Discharge: 2023-12-31 | Disposition: A | Discharge status: Home / Self Care | Attending: Gastroenterology | Admitting: Gastroenterology

## 2023-12-31 ENCOUNTER — Encounter
Admission: RE | Disposition: A | Discharge status: Home / Self Care | Payer: Self-pay | Source: Home / Self Care | Attending: Gastroenterology

## 2023-12-31 ENCOUNTER — Encounter: Payer: Self-pay | Admitting: Gastroenterology

## 2023-12-31 DIAGNOSIS — Z860101 Personal history of adenomatous and serrated colon polyps: Secondary | ICD-10-CM | POA: Insufficient documentation

## 2023-12-31 DIAGNOSIS — Z09 Encounter for follow-up examination after completed treatment for conditions other than malignant neoplasm: Secondary | ICD-10-CM | POA: Diagnosis present

## 2023-12-31 DIAGNOSIS — Z7984 Long term (current) use of oral hypoglycemic drugs: Secondary | ICD-10-CM | POA: Insufficient documentation

## 2023-12-31 DIAGNOSIS — Z1211 Encounter for screening for malignant neoplasm of colon: Secondary | ICD-10-CM | POA: Diagnosis present

## 2023-12-31 DIAGNOSIS — E119 Type 2 diabetes mellitus without complications: Secondary | ICD-10-CM | POA: Insufficient documentation

## 2023-12-31 HISTORY — PX: COLONOSCOPY: SHX5424

## 2023-12-31 LAB — GLUCOSE, CAPILLARY: Glucose-Capillary: 89 mg/dL (ref 70–99)

## 2023-12-31 SURGERY — COLONOSCOPY
Anesthesia: General

## 2023-12-31 MED ORDER — PROPOFOL 1000 MG/100ML IV EMUL
INTRAVENOUS | Status: AC
  Filled 2023-12-31: qty 100

## 2023-12-31 MED ORDER — PROPOFOL 500 MG/50ML IV EMUL
INTRAVENOUS | Status: DC | PRN
  Administered 2023-12-31: 130 ug/kg/min via INTRAVENOUS

## 2023-12-31 MED ORDER — SODIUM CHLORIDE 0.9 % IV SOLN
INTRAVENOUS | Status: DC
  Administered 2023-12-31: 500 mL via INTRAVENOUS

## 2023-12-31 MED ORDER — DEXMEDETOMIDINE HCL IN NACL 80 MCG/20ML IV SOLN
INTRAVENOUS | Status: DC | PRN
  Administered 2023-12-31: 12 ug via INTRAVENOUS

## 2023-12-31 MED ORDER — PROPOFOL 10 MG/ML IV BOLUS
INTRAVENOUS | Status: DC | PRN
  Administered 2023-12-31: 30 mg via INTRAVENOUS
  Administered 2023-12-31: 20 mg via INTRAVENOUS
  Administered 2023-12-31: 70 mg via INTRAVENOUS

## 2023-12-31 MED ORDER — LIDOCAINE HCL (CARDIAC) PF 100 MG/5ML IV SOSY
PREFILLED_SYRINGE | INTRAVENOUS | Status: DC | PRN
  Administered 2023-12-31: 60 mg via INTRAVENOUS

## 2023-12-31 NOTE — Transfer of Care (Signed)
 Immediate Anesthesia Transfer of Care Note  Patient: Robert Holland  Procedure(s) Performed: COLONOSCOPY  Patient Location: Endoscopy Unit  Anesthesia Type:General  Level of Consciousness: drowsy  Airway & Oxygen Therapy: Patient Spontanous Breathing  Post-op Assessment: Report given to RN  Post vital signs: stable  Last Vitals:  Vitals Value Taken Time  BP 109/75 12/31/23 08:36  Temp    Pulse    Resp 14 12/31/23 08:36  SpO2    Vitals shown include unfiled device data.  Last Pain:  Vitals:   12/31/23 0737  TempSrc: Temporal  PainSc: 0-No pain         Complications: No notable events documented.

## 2023-12-31 NOTE — Anesthesia Postprocedure Evaluation (Signed)
 Anesthesia Post Note  Patient: Robert Holland  Procedure(s) Performed: COLONOSCOPY  Patient location during evaluation: Endoscopy Anesthesia Type: General Level of consciousness: awake and alert Pain management: pain level controlled Vital Signs Assessment: post-procedure vital signs reviewed and stable Respiratory status: spontaneous breathing, nonlabored ventilation and respiratory function stable Cardiovascular status: blood pressure returned to baseline and stable Postop Assessment: no apparent nausea or vomiting Anesthetic complications: no   No notable events documented.   Last Vitals:  Vitals:   12/31/23 0846 12/31/23 0856  BP: 107/78 104/70  Pulse: 85 86  Resp: 14 15  Temp:    SpO2: 98% 99%    Last Pain:  Vitals:   12/31/23 0856  TempSrc:   PainSc: 0-No pain                 Fairy POUR Viola Kinnick

## 2023-12-31 NOTE — Op Note (Signed)
 [facility] Gastroenterology Patient Name: Robert Holland Procedure Date: 12/31/2023 8:16 AM MRN: [id number] Account #: [id number] Date of Birth: 04-10-1965 Admit Type: Outpatient Age: 58 Room: [facility] ENDO ROOM 3 Gender: Male Note Status: Finalized Instrument Name: Colon Scope [id number] Procedure:             Colonoscopy Indications:           Surveillance: Personal history of adenomatous polyps                         on last colonoscopy 3 years ago, Last colonoscopy:                         August 2022 Providers:             Corinn Jess Brooklyn MD, MD Referring MD:          Lynwood [name]. Wendee (Referring MD) Medicines:             General Anesthesia Complications:         No immediate complications. Procedure:             Pre-Anesthesia Assessment:                        - Prior to the procedure, a History and Physical was                         performed, and patient medications and allergies were                         reviewed. The patient is competent. The risks and                         benefits of the procedure and the sedation options and                         risks were discussed with the patient. All questions                         were answered and informed consent was obtained.                         Patient identification and proposed procedure were                         verified by the physician, the nurse, the                         anesthesiologist, the anesthetist and the technician                         in the pre-procedure area in the procedure room in the                         endoscopy suite. Mental Status Examination: alert and                         oriented. Airway Examination: normal oropharyngeal  airway and neck mobility. Respiratory Examination:                         clear to auscultation. CV Examination: normal.                         Prophylactic Antibiotics: The patient does not require                          prophylactic antibiotics. Prior Anticoagulants: The                         patient has taken no anticoagulant or antiplatelet                         agents. ASA Grade Assessment: III - A patient with                         severe systemic disease. After reviewing the risks and                         benefits, the patient was deemed in satisfactory                         condition to undergo the procedure. The anesthesia                         plan was to use general anesthesia. Immediately prior                         to administration of medications, the patient was                         re-assessed for adequacy to receive sedatives. The                         heart rate, respiratory rate, oxygen saturations,                         blood pressure, adequacy of pulmonary ventilation, and                         response to care were monitored throughout the                         procedure. The physical status of the patient was                         re-assessed after the procedure.                        After obtaining informed consent, the colonoscope was                         passed under direct vision. Throughout the procedure,                         the patient's blood pressure, pulse, and oxygen  saturations were monitored continuously. The                         Colonoscope was introduced through the anus and                         advanced to the the cecum, identified by appendiceal                         orifice and ileocecal valve. The colonoscopy was                         performed without difficulty. The patient tolerated                         the procedure well. The quality of the bowel                         preparation was evaluated using the BBPS [facility] Bowel                         Preparation Scale) with scores of: Right Colon = 3,                         Transverse Colon = 3 and Left Colon = 3 (entire mucosa                          seen well with no residual staining, small fragments                         of stool or opaque liquid). The total BBPS score                         equals 9. The ileocecal valve, appendiceal orifice,                         and rectum were photographed. Findings:      The perianal and digital rectal examinations were normal. Pertinent       negatives include normal sphincter tone and no palpable rectal lesions.      The entire examined colon appeared normal.      The retroflexed view of the distal rectum and anal verge was normal and       showed no anal or rectal abnormalities. Impression:            - The entire examined colon is normal.                        - The distal rectum and anal verge are normal on                         retroflexion view.                        - No specimens collected. Recommendation:        - Discharge patient to home (with escort).                        -  Resume previous diet today.                        - Continue present medications.                        - Repeat colonoscopy in 5 years for surveillance. Procedure Code(s):     --- Professional ---                        H9894, Colorectal cancer screening; colonoscopy on                         individual at high risk Diagnosis Code(s):     --- Professional ---                        Z86.010, Personal history of colonic polyps CPT copyright 2022 American Medical Association. All rights reserved. The codes documented in this report are preliminary and upon coder review may  be revised to meet current compliance requirements. Dr. Corinn Brooklyn Corinn Jess Brooklyn MD, MD 12/31/2023 8:36:08 AM This report has been signed electronically. Number of Addenda: 0 Note Initiated On: 12/31/2023 8:16 AM Scope Withdrawal Time: 0 hours 8 minutes 2 seconds  Total Procedure Duration: 0 hours 11 minutes 45 seconds  Estimated Blood Loss:  Estimated blood loss: none.      [facility]

## 2023-12-31 NOTE — Anesthesia Preprocedure Evaluation (Signed)
 Anesthesia Evaluation  Patient identified by MRN, date of birth, ID band Patient awake    Reviewed: Allergy & Precautions, NPO status , Patient's Chart, lab work & pertinent test results  Airway Mallampati: III  TM Distance: <3 FB Neck ROM: full    Dental  (+) Chipped   Pulmonary neg pulmonary ROS, neg shortness of breath   Pulmonary exam normal        Cardiovascular Exercise Tolerance: Good (-) angina negative cardio ROS Normal cardiovascular exam     Neuro/Psych negative neurological ROS  negative psych ROS   GI/Hepatic negative GI ROS, Neg liver ROS,neg GERD  ,,  Endo/Other  diabetes, Type 2    Renal/GU negative Renal ROS  negative genitourinary   Musculoskeletal   Abdominal   Peds  Hematology negative hematology ROS (+)   Anesthesia Other Findings Past Medical History: No date: Back pain No date: Diabetes mellitus without complication (HCC) 2017: Genital warts No date: Hyperlipidemia No date: Joint pain  Past Surgical History: 09/01/2017: COLONOSCOPY WITH PROPOFOL ; N/A     Comment:  Procedure: COLONOSCOPY WITH PROPOFOL ;  Surgeon: Unk Corinn Skiff, MD;  Location: [facility] ENDOSCOPY;  Service:               Gastroenterology;  Laterality: N/A; 10/17/2020: COLONOSCOPY WITH PROPOFOL ; N/A     Comment:  Procedure: COLONOSCOPY WITH PROPOFOL ;  Surgeon: Unk Corinn Skiff, MD;  Location: [facility] ENDOSCOPY;  Service:               Gastroenterology;  Laterality: N/A; 2016: HERNIA REPAIR 2003: herniated disk fusion   BMI    Body Mass Index: 28.89 kg/m      Reproductive/Obstetrics negative OB ROS                              Anesthesia Physical Anesthesia Plan  ASA: 3  Anesthesia Plan: General   Post-op Pain Management:    Induction: Intravenous  PONV Risk Score and Plan: Propofol  infusion and TIVA  Airway Management Planned: Natural Airway and Nasal  Cannula  Additional Equipment:   Intra-op Plan:   Post-operative Plan:   Informed Consent: I have reviewed the patients History and Physical, chart, labs and discussed the procedure including the risks, benefits and alternatives for the proposed anesthesia with the patient or authorized representative who has indicated his/her understanding and acceptance.     Dental Advisory Given  Plan Discussed with: Anesthesiologist, CRNA and Surgeon  Anesthesia Plan Comments: (Patient consented for risks of anesthesia including but not limited to:  - adverse reactions to medications - risk of airway placement if required - damage to eyes, teeth, lips or other oral mucosa - nerve damage due to positioning  - sore throat or hoarseness - Damage to heart, brain, nerves, lungs, other parts of body or loss of life  Patient voiced understanding and assent.)        Anesthesia Quick Evaluation

## 2023-12-31 NOTE — H&P (Signed)
 Robert JONELLE Brooklyn, MD [facility] Gastroenterology, [facility] [address]  [place], [place] [address]  Main: [phone] Fax:  [phone] Pager: [phone]   Primary Care Physician:  Wendee Lynwood [name], NP Primary Gastroenterologist:  Dr. Corinn JONELLE Holland  Pre-Procedure History & Physical: HPI:  Robert Holland is a 58 y.o. male is here for an colonoscopy.   Past Medical History:  Diagnosis Date   Back pain    Diabetes mellitus without complication (HCC)    Genital warts 2017   Hyperlipidemia    Joint pain     Past Surgical History:  Procedure Laterality Date   COLONOSCOPY WITH PROPOFOL  N/A 09/01/2017   Procedure: COLONOSCOPY WITH PROPOFOL ;  Surgeon: Holland Robert Skiff, MD;  Location: [facility] ENDOSCOPY;  Service: Gastroenterology;  Laterality: N/A;   COLONOSCOPY WITH PROPOFOL  N/A 10/17/2020   Procedure: COLONOSCOPY WITH PROPOFOL ;  Surgeon: Holland Robert Skiff, MD;  Location: [facility] ENDOSCOPY;  Service: Gastroenterology;  Laterality: N/A;   HERNIA REPAIR  2016   herniated disk fusion   2003    Prior to Admission medications   Medication Sig Start Date End Date Taking? Authorizing Provider  atorvastatin  (LIPITOR) 10 MG tablet TAKE ONE TABLET BY MOUTH EVERY DAY 11/09/23  Yes Wendee Lynwood [name], NP  Cholecalciferol (VITAMIN D -1000 MAX ST) 25 MCG (1000 UT) tablet Take 1,000 Units by mouth daily.   Yes [provider]  cyanocobalamin (VITAMIN B12) 1000 MCG tablet Take 1,000 mcg by mouth daily.   Yes [provider]  empagliflozin  (JARDIANCE ) 10 MG TABS tablet Take 1 tablet (10 mg total) by mouth daily before breakfast. 11/05/23  Yes Wendee Lynwood [name], NP  metFORMIN  (GLUCOPHAGE -XR) 500 MG 24 hr tablet Take 1 tablet (500 mg total) by mouth daily with breakfast. 11/05/23  Yes Wendee Lynwood [name], NP  Multiple Vitamin (MULTIVITAMIN) tablet Take 1 tablet by mouth daily.   Yes [provider]  omeprazole  (PRILOSEC) 20 MG capsule TAKE 1 CAPSULE(20 MG) BY MOUTH DAILY 12/16/23  Yes  Wendee Lynwood [name], NP  oxyCODONE  (ROXICODONE ) 5 MG immediate release tablet Take 1 tablet (5 mg total) by mouth every 6 (six) hours as needed for severe pain (pain score 7-10). Patient not taking: Reported on 12/31/2023 08/22/23   Zackowski, Scott, MD  Semaglutide , 2 MG/DOSE, (OZEMPIC , 2 MG/DOSE,) 8 MG/3ML SOPN Inject 2 mg into the skin once a week. Patient not taking: Reported on 12/31/2023 08/07/23   Wendee Lynwood [name], NP    Allergies as of 12/22/2023   (No Known Allergies)    Family History  Problem Relation Age of Onset   Diabetes Mother    Depression Mother    Liver disease Mother    Cancer Father 57       brain tumor   Breast cancer Paternal Aunt        dx after 60   Bone cancer Paternal Uncle        dx after 51   Breast cancer Maternal Grandmother        dx 36s   Breast cancer Paternal Grandmother        dx early 37s   Breast cancer Cousin        paternal first cousin; dx 18s-40s    Social History   Socioeconomic History   Marital status: Married    Spouse name: [name]   Number of children: 2   Years of education: high school   Highest education level: Not on file  Occupational History   Occupation:  Paediatric Nurse  Tobacco Use   Smoking status: Never   Smokeless tobacco: Current    Types: Snuff  Vaping Use   Vaping status: Never Used  Substance and Sexual Activity   Alcohol use: Not Currently   Drug use: Never   Sexual activity: Yes    Partners: Female  Other Topics Concern   Not on file  Social History Narrative   05/24/20   From: the area   Living: with wife, [name] (2019) and his step son and daughter-in-law   Work: field seismologist, previously worked in the prison      Family: 2 children - [name] and Hospital Doctor - 3 grandchildren/ 2 step children      Enjoys: fish, golf, hunt, go shopping with wife      Exercise: active job    Diet: not great, but hoping to make a change      Safety   Seat belts: Yes    Guns: Yes   and secure   Safe in relationships: Yes    Social Drivers of Corporate Investment Banker Strain: Not on file  Food Insecurity: Not on file  Transportation Needs: Not on file  Physical Activity: Not on file  Stress: Not on file  Social Connections: Not on file  Intimate Partner Violence: Not on file    Review of Systems: See HPI, otherwise negative ROS  Physical Exam: BP 122/84   Pulse 84   Temp (!) 96.6 F (35.9 C) (Temporal)   Resp 18   Ht 6' 1 (1.854 m)   Wt 99.3 kg   SpO2 98%   BMI 28.89 kg/m  General:   Alert,  pleasant and cooperative in NAD Head:  Normocephalic and atraumatic. Neck:  Supple; no masses or thyromegaly. Lungs:  Clear throughout to auscultation.    Heart:  Regular rate and rhythm. Abdomen:  Soft, nontender and nondistended. Normal bowel sounds, without guarding, and without rebound.   Neurologic:  Alert and  oriented x4;  grossly normal neurologically.  Impression/Plan: Robert Holland is here for an colonoscopy to be performed for h/o colon adenomas  Risks, benefits, limitations, and alternatives regarding  colonoscopy have been reviewed with the patient.  Questions have been answered.  All parties agreeable.   Robert Brooklyn, MD  12/31/2023, 8:13 AM

## 2024-02-23 ENCOUNTER — Ambulatory Visit: Admitting: Nurse Practitioner

## 2024-02-23 VITALS — BP 100/70 | HR 70 | Temp 97.8°F | Ht 73.0 in | Wt 247.6 lb

## 2024-02-23 DIAGNOSIS — K7460 Unspecified cirrhosis of liver: Secondary | ICD-10-CM

## 2024-02-23 DIAGNOSIS — E669 Obesity, unspecified: Secondary | ICD-10-CM

## 2024-02-23 DIAGNOSIS — E1169 Type 2 diabetes mellitus with other specified complication: Secondary | ICD-10-CM | POA: Diagnosis not present

## 2024-02-23 DIAGNOSIS — Z7985 Long-term (current) use of injectable non-insulin antidiabetic drugs: Secondary | ICD-10-CM

## 2024-02-23 DIAGNOSIS — Z6832 Body mass index (BMI) 32.0-32.9, adult: Secondary | ICD-10-CM

## 2024-02-23 LAB — POCT GLYCOSYLATED HEMOGLOBIN (HGB A1C): Hemoglobin A1C: 5.3 % (ref 4.0–5.6)

## 2024-02-23 MED ORDER — OZEMPIC (0.25 OR 0.5 MG/DOSE) 2 MG/3ML ~~LOC~~ SOPN: 0.2500 mg | PEN_INJECTOR | SUBCUTANEOUS | 0 refills | Status: AC

## 2024-02-23 NOTE — Patient Instructions (Signed)
 Nice to see you today I have started back the ozempic . We will do 0.25mg  once a week for 4 weeks then increase to 0.5mg  weekly there after I have attached signs and symptoms of low sugar.   Follow up with me in 3 months, sooner if you need me

## 2024-02-23 NOTE — Progress Notes (Signed)
 "  Established Patient Office Visit  Subjective   Patient ID: Robert Holland, male    DOB: 1965/06/29  Age: 59 y.o. MRN: [id number]  Chief Complaint  Patient presents with   Follow-up    HPI  Discussed the use of AI scribe software for clinical note transcription with the patient, who gave verbal consent to proceed.  History of Present Illness Keven Holland is a 59 year old male with weight management issues and liver disease who presents for follow-up after discontinuing ozempic  and experiencing weight gain.  He has gained significant weight over the holiday period, increasing from 219 pounds in September to 247 pounds currently, which he attributes to poor eating habits.  He discontinued ozempic  about a month and a half ago due to adverse effects, including feeling nauseated. Since stopping ozempic , he no longer experiences nausea and vomiting. He continues to take metformin  and Jardiance  but has not been regularly checking his blood sugar levels, although he has the necessary equipment. His A1c is currently at 5.3%.  He experiences occasional lightheadedness and dizziness, particularly while driving, but these episodes have been infrequent. He consumes two meals a day with some snacking and drinks tea, water, and coffee throughout the day.  He has been off Ozempic  for an extended period, which he notes was previously beneficial for both weight loss and liver health. He is concerned about his liver health, mentioning a history of medical liver disease and the potential risk of cirrhosis. No issues with bowel movements while on Ozempic . He has discontinued omeprazole  after stopping ozempic . He was previously on ozempic  2mg  weekly     Fibrosis 4 Score = 1.51 (Indeterminate)       Interpretation for patients with NAFLD          <1.30       -  F0-F1 (Low risk)          1.30-2.67 -  Indeterminate           >2.67      -  F3-F4 (High risk)     Validated for ages 75-65          Review of  Systems  Constitutional:  Negative for chills and fever.  Respiratory:  Negative for shortness of breath.   Cardiovascular:  Negative for chest pain.  Neurological:  Negative for headaches.      Objective:     BP 100/70   Pulse 70   Temp 97.8 F (36.6 C) (Oral)   Ht 6' 1 (1.854 m)   Wt 247 lb 9.6 oz (112.3 kg)   SpO2 97%   BMI 32.67 kg/m  BP Readings from Last 3 Encounters:  02/23/24 100/70  12/31/23 104/70  11/05/23 122/80   Wt Readings from Last 3 Encounters:  02/23/24 247 lb 9.6 oz (112.3 kg)  12/31/23 219 lb (99.3 kg)  11/05/23 223 lb 6.4 oz (101.3 kg)   SpO2 Readings from Last 3 Encounters:  02/23/24 97%  12/31/23 99%  11/05/23 96%      Physical Exam Vitals and nursing note reviewed.  Constitutional:      Appearance: Normal appearance.  Cardiovascular:     Rate and Rhythm: Normal rate and regular rhythm.     Heart sounds: Normal heart sounds.  Pulmonary:     Effort: Pulmonary effort is normal.     Breath sounds: Normal breath sounds.  Abdominal:     General: Bowel sounds are normal.  Neurological:     Mental Status: He is  alert.      Results for orders placed or performed in visit on 02/23/24  POCT glycosylated hemoglobin (Hb A1C)  Result Value Ref Range   Hemoglobin A1C 5.3 4.0 - 5.6 %   HbA1c POC (<> result, manual entry)     HbA1c, POC (prediabetic range)     HbA1c, POC (controlled diabetic range)        The ASCVD Risk score (Arnett DK, et al., 2019) failed to calculate for the following reasons:   The valid total cholesterol range is 130 to 320 mg/dL    Assessment & Plan:   Problem List Items Addressed This Visit       Digestive   Cirrhosis of liver (HCC)   Relevant Medications   Semaglutide ,0.25 or 0.5MG /DOS, (OZEMPIC , 0.25 OR 0.5 MG/DOSE,) 2 MG/3ML SOPN     Endocrine   Type 2 diabetes mellitus with other specified complication (HCC) - Primary   Relevant Medications   Semaglutide ,0.25 or 0.5MG /DOS, (OZEMPIC , 0.25 OR 0.5  MG/DOSE,) 2 MG/3ML SOPN   Other Relevant Orders   POCT glycosylated hemoglobin (Hb A1C) (Completed)     Other   Obesity (BMI 30-39.9)   Relevant Medications   Semaglutide ,0.25 or 0.5MG /DOS, (OZEMPIC , 0.25 OR 0.5 MG/DOSE,) 2 MG/3ML SOPN   Assessment and Plan Assessment & Plan 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 A1c well-controlled at 5.3% without it. Occasional lightheadedness possibly due to hypoglycemia. Discussed restarting Semaglutide  for weight and liver health, with hypoglycemia risk. - Restarted Semaglutide  at 0.25 mg subcutaneous weekly for one month, then increase to 0.5 mg weekly. - Provided education on signs and symptoms of hypoglycemia. - Continue Metformin  and Empagliflozin  as prescribed. - Monitor blood glucose levels, especially if experiencing symptoms of hypoglycemia.  Cirrhosis of the liver  Cirrhosis with concern for liver health. Semaglutide  can reduce liver fat deposition and improve liver health. - Restarted Semaglutide  as per diabetes management plan to aid in liver health.  Obesity Recent weight gain of 20 pounds since September. Previously on Semaglutide  for weight management, discontinued due to gastrointestinal side effects. Discussed restarting Semaglutide  for weight management and liver health benefits. - Restarted Semaglutide  as per diabetes management plan to aid in weight management.  Return in about 3 months (around 05/23/2024) for CPE and Labs.    Adina Crandall, NP  "

## 2024-03-09 LAB — OPHTHALMOLOGY REPORT-SCANNED

## 2024-05-24 ENCOUNTER — Encounter: Admitting: Nurse Practitioner
